# Patient Record
Sex: Female | Born: 1980 | Race: White | Hispanic: No | Marital: Married | Smoking: Never smoker
Health system: Southern US, Community
[De-identification: ages and names within clinical notes are randomized; demographics above are authoritative.]

## PROBLEM LIST (undated history)

## (undated) DIAGNOSIS — N189 Chronic kidney disease, unspecified: Secondary | ICD-10-CM

## (undated) DIAGNOSIS — R51 Headache: Secondary | ICD-10-CM

## (undated) DIAGNOSIS — I10 Essential (primary) hypertension: Secondary | ICD-10-CM

## (undated) DIAGNOSIS — IMO0002 Reserved for concepts with insufficient information to code with codable children: Secondary | ICD-10-CM

## (undated) DIAGNOSIS — D649 Anemia, unspecified: Secondary | ICD-10-CM

## (undated) HISTORY — PX: KIDNEY TRANSPLANT: SHX239

---

## 2003-02-28 ENCOUNTER — Encounter: Payer: Self-pay | Admitting: Family Medicine

## 2003-02-28 ENCOUNTER — Ambulatory Visit (HOSPITAL_COMMUNITY): Admission: RE | Admit: 2003-02-28 | Discharge: 2003-02-28 | Payer: Self-pay | Admitting: Family Medicine

## 2003-12-14 ENCOUNTER — Encounter: Admission: RE | Admit: 2003-12-14 | Discharge: 2003-12-14 | Payer: Self-pay | Admitting: Internal Medicine

## 2004-01-02 ENCOUNTER — Encounter: Admission: RE | Admit: 2004-01-02 | Discharge: 2004-01-02 | Payer: Self-pay | Admitting: Internal Medicine

## 2004-02-13 ENCOUNTER — Encounter: Admission: RE | Admit: 2004-02-13 | Discharge: 2004-02-13 | Payer: Self-pay | Admitting: Internal Medicine

## 2004-06-04 ENCOUNTER — Ambulatory Visit: Payer: Self-pay | Admitting: Internal Medicine

## 2004-06-11 ENCOUNTER — Ambulatory Visit: Payer: Self-pay | Admitting: Internal Medicine

## 2004-08-15 ENCOUNTER — Ambulatory Visit: Payer: Self-pay | Admitting: Internal Medicine

## 2004-10-01 ENCOUNTER — Ambulatory Visit: Payer: Self-pay | Admitting: Internal Medicine

## 2005-07-08 ENCOUNTER — Ambulatory Visit: Payer: Self-pay | Admitting: Internal Medicine

## 2005-08-21 ENCOUNTER — Ambulatory Visit: Payer: Self-pay | Admitting: Internal Medicine

## 2005-09-18 ENCOUNTER — Ambulatory Visit: Payer: Self-pay | Admitting: Internal Medicine

## 2005-10-16 ENCOUNTER — Encounter: Admission: RE | Admit: 2005-10-16 | Discharge: 2005-10-16 | Payer: Self-pay | Admitting: Nephrology

## 2005-10-16 ENCOUNTER — Ambulatory Visit: Payer: Self-pay | Admitting: Internal Medicine

## 2006-01-02 ENCOUNTER — Ambulatory Visit: Payer: Self-pay | Admitting: Internal Medicine

## 2006-03-20 ENCOUNTER — Ambulatory Visit: Payer: Self-pay | Admitting: Internal Medicine

## 2006-03-30 ENCOUNTER — Ambulatory Visit: Payer: Self-pay | Admitting: Internal Medicine

## 2006-04-20 ENCOUNTER — Ambulatory Visit: Payer: Self-pay | Admitting: Internal Medicine

## 2006-07-21 ENCOUNTER — Encounter (INDEPENDENT_AMBULATORY_CARE_PROVIDER_SITE_OTHER): Payer: Self-pay | Admitting: Internal Medicine

## 2006-07-31 ENCOUNTER — Ambulatory Visit: Payer: Self-pay | Admitting: Internal Medicine

## 2006-07-31 LAB — CONVERTED CEMR LAB
AST: 18 units/L (ref 0–37)
BUN: 31 mg/dL — ABNORMAL HIGH (ref 6–23)
CO2: 24 meq/L (ref 19–32)
Chloride: 106 meq/L (ref 96–112)
HCT: 33.2 % — ABNORMAL LOW (ref 36.0–46.0)
Hemoglobin: 11.2 g/dL — ABNORMAL LOW (ref 12.0–15.0)
Platelets: 222 10*3/uL (ref 150–400)
RBC: 3.84 M/uL — ABNORMAL LOW (ref 3.87–5.11)
Total Bilirubin: 1.3 mg/dL — ABNORMAL HIGH (ref 0.3–1.2)
Total Protein: 6.2 g/dL (ref 6.0–8.3)

## 2006-09-07 ENCOUNTER — Ambulatory Visit: Payer: Self-pay | Admitting: Internal Medicine

## 2006-09-07 LAB — CONVERTED CEMR LAB
BUN: 36 mg/dL — ABNORMAL HIGH (ref 6–23)
Calcium: 9.2 mg/dL (ref 8.4–10.5)
Chloride: 107 meq/L (ref 96–112)
Creatinine, Ser: 2.96 mg/dL — ABNORMAL HIGH (ref 0.40–1.20)
Glucose, Bld: 81 mg/dL (ref 70–99)

## 2006-10-21 ENCOUNTER — Encounter (INDEPENDENT_AMBULATORY_CARE_PROVIDER_SITE_OTHER): Payer: Self-pay | Admitting: Internal Medicine

## 2006-10-22 ENCOUNTER — Encounter (INDEPENDENT_AMBULATORY_CARE_PROVIDER_SITE_OTHER): Payer: Self-pay | Admitting: Internal Medicine

## 2006-10-23 ENCOUNTER — Ambulatory Visit: Payer: Self-pay | Admitting: Internal Medicine

## 2007-01-07 ENCOUNTER — Encounter (INDEPENDENT_AMBULATORY_CARE_PROVIDER_SITE_OTHER): Payer: Self-pay | Admitting: Internal Medicine

## 2007-02-10 ENCOUNTER — Encounter (INDEPENDENT_AMBULATORY_CARE_PROVIDER_SITE_OTHER): Payer: Self-pay | Admitting: Internal Medicine

## 2007-03-26 ENCOUNTER — Encounter: Admission: RE | Admit: 2007-03-26 | Discharge: 2007-03-26 | Payer: Self-pay | Admitting: Family Medicine

## 2007-05-10 ENCOUNTER — Encounter (INDEPENDENT_AMBULATORY_CARE_PROVIDER_SITE_OTHER): Payer: Self-pay | Admitting: Internal Medicine

## 2007-10-08 ENCOUNTER — Encounter (INDEPENDENT_AMBULATORY_CARE_PROVIDER_SITE_OTHER): Payer: Self-pay | Admitting: Internal Medicine

## 2008-02-09 ENCOUNTER — Encounter (INDEPENDENT_AMBULATORY_CARE_PROVIDER_SITE_OTHER): Payer: Self-pay | Admitting: Internal Medicine

## 2008-04-28 ENCOUNTER — Encounter (INDEPENDENT_AMBULATORY_CARE_PROVIDER_SITE_OTHER): Payer: Self-pay | Admitting: Internal Medicine

## 2008-07-07 ENCOUNTER — Encounter (INDEPENDENT_AMBULATORY_CARE_PROVIDER_SITE_OTHER): Payer: Self-pay | Admitting: Internal Medicine

## 2008-08-23 ENCOUNTER — Encounter (INDEPENDENT_AMBULATORY_CARE_PROVIDER_SITE_OTHER): Payer: Self-pay | Admitting: Internal Medicine

## 2008-10-06 ENCOUNTER — Encounter (HOSPITAL_COMMUNITY): Admission: RE | Admit: 2008-10-06 | Discharge: 2009-01-04 | Payer: Self-pay

## 2008-11-16 ENCOUNTER — Encounter (INDEPENDENT_AMBULATORY_CARE_PROVIDER_SITE_OTHER): Payer: Self-pay | Admitting: Internal Medicine

## 2009-01-05 ENCOUNTER — Encounter (HOSPITAL_COMMUNITY): Admission: RE | Admit: 2009-01-05 | Discharge: 2009-04-05 | Payer: Self-pay | Admitting: Nephrology

## 2009-04-19 ENCOUNTER — Encounter (HOSPITAL_COMMUNITY): Admission: RE | Admit: 2009-04-19 | Discharge: 2009-07-18 | Payer: Self-pay | Admitting: Nephrology

## 2009-07-19 ENCOUNTER — Encounter (HOSPITAL_COMMUNITY): Admission: RE | Admit: 2009-07-19 | Discharge: 2009-10-17 | Payer: Self-pay | Admitting: Nephrology

## 2009-10-19 ENCOUNTER — Encounter (HOSPITAL_COMMUNITY): Admission: RE | Admit: 2009-10-19 | Discharge: 2010-01-17 | Payer: Self-pay | Admitting: Nephrology

## 2010-02-01 ENCOUNTER — Encounter (HOSPITAL_COMMUNITY): Admission: RE | Admit: 2010-02-01 | Discharge: 2010-05-02 | Payer: Self-pay | Admitting: Nephrology

## 2010-03-05 ENCOUNTER — Other Ambulatory Visit: Admission: RE | Admit: 2010-03-05 | Discharge: 2010-03-05 | Payer: Self-pay | Admitting: Family Medicine

## 2010-05-06 ENCOUNTER — Encounter (HOSPITAL_COMMUNITY)
Admission: RE | Admit: 2010-05-06 | Discharge: 2010-08-04 | Payer: Self-pay | Source: Home / Self Care | Attending: Nephrology | Admitting: Nephrology

## 2010-06-20 DIAGNOSIS — N189 Chronic kidney disease, unspecified: Secondary | ICD-10-CM

## 2010-06-20 HISTORY — DX: Chronic kidney disease, unspecified: N18.9

## 2010-08-11 ENCOUNTER — Encounter: Payer: Self-pay | Admitting: Obstetrics and Gynecology

## 2010-08-18 LAB — CONVERTED CEMR LAB
ALT: 9 units/L (ref 0–35)
AST: 18 units/L (ref 0–37)
Albumin: 3.9 g/dL (ref 3.5–5.2)
Alkaline Phosphatase: 47 units/L (ref 39–117)
Bilirubin Urine: NEGATIVE
Calcium: 9.5 mg/dL (ref 8.4–10.5)
HCT: 33.7 % — ABNORMAL LOW (ref 36.0–46.0)
Hemoglobin: 11.6 g/dL — ABNORMAL LOW (ref 12.0–15.0)
Ketones, ur: NEGATIVE mg/dL
Leukocytes, UA: NEGATIVE
MCHC: 34.4 g/dL (ref 30.0–36.0)
MCV: 85.3 fL (ref 78.0–100.0)
PTH: 47.7 pg/mL (ref 14.0–72.0)
Platelets: 251 10*3/uL (ref 150–400)
Protein, ur: 100 mg/dL — AB
RBC: 3.95 M/uL (ref 3.87–5.11)
RDW: 13 % (ref 11.5–14.0)
Sodium: 137 meq/L (ref 135–145)
Specific Gravity, Urine: 1.009 (ref 1.005–1.03)
Total CHOL/HDL Ratio: 2.8
Urine Glucose: NEGATIVE mg/dL
Urobilinogen, UA: 0.2 (ref 0.0–1.0)
WBC: 6.9 10*3/uL (ref 4.0–10.5)
pH: 6 (ref 5.0–8.0)

## 2010-08-19 ENCOUNTER — Other Ambulatory Visit (HOSPITAL_COMMUNITY): Payer: Self-pay | Admitting: Endocrinology

## 2010-08-19 DIAGNOSIS — E0590 Thyrotoxicosis, unspecified without thyrotoxic crisis or storm: Secondary | ICD-10-CM

## 2010-09-04 ENCOUNTER — Ambulatory Visit (HOSPITAL_COMMUNITY)
Admission: RE | Admit: 2010-09-04 | Discharge: 2010-09-04 | Disposition: A | Discharge status: Home / Self Care | Payer: Managed Care, Other (non HMO) | Source: Ambulatory Visit | Attending: Endocrinology | Admitting: Endocrinology

## 2010-09-04 DIAGNOSIS — E0590 Thyrotoxicosis, unspecified without thyrotoxic crisis or storm: Secondary | ICD-10-CM

## 2010-09-05 ENCOUNTER — Ambulatory Visit (HOSPITAL_COMMUNITY)
Admission: RE | Admit: 2010-09-05 | Discharge: 2010-09-05 | Disposition: A | Discharge status: Home / Self Care | Payer: Managed Care, Other (non HMO) | Source: Ambulatory Visit | Attending: Endocrinology | Admitting: Endocrinology

## 2010-09-05 DIAGNOSIS — E0590 Thyrotoxicosis, unspecified without thyrotoxic crisis or storm: Secondary | ICD-10-CM | POA: Insufficient documentation

## 2010-09-05 MED ORDER — SODIUM PERTECHNETATE TC 99M INJECTION
10.0000 | Freq: Once | INTRAVENOUS | Status: AC | PRN
  Administered 2010-09-05: 10 via INTRAVENOUS

## 2010-09-05 MED ORDER — SODIUM IODIDE I 131 CAPSULE
1.0000 | Freq: Once | INTRAVENOUS | Status: AC | PRN
  Administered 2010-09-05: 1 via ORAL

## 2010-10-01 LAB — POCT HEMOGLOBIN-HEMACUE: Hemoglobin: 10.2 g/dL — ABNORMAL LOW (ref 12.0–15.0)

## 2010-10-01 LAB — IRON AND TIBC
Iron: 94 ug/dL (ref 42–135)
Saturation Ratios: 31 % (ref 20–55)
TIBC: 299 ug/dL (ref 250–470)

## 2010-10-02 LAB — IRON AND TIBC
Saturation Ratios: 34 % (ref 20–55)
TIBC: 313 ug/dL (ref 250–470)
UIBC: 207 ug/dL

## 2010-10-03 LAB — POCT HEMOGLOBIN-HEMACUE: Hemoglobin: 10.7 g/dL — ABNORMAL LOW (ref 12.0–15.0)

## 2010-10-03 LAB — IRON AND TIBC: Iron: 96 ug/dL (ref 42–135)

## 2010-10-04 LAB — IRON AND TIBC
Iron: 138 ug/dL — ABNORMAL HIGH (ref 42–135)
Saturation Ratios: 40 % (ref 20–55)
TIBC: 348 ug/dL (ref 250–470)

## 2010-10-04 LAB — FERRITIN: Ferritin: 302 ng/mL — ABNORMAL HIGH (ref 10–291)

## 2010-10-04 LAB — POCT HEMOGLOBIN-HEMACUE: Hemoglobin: 10.8 g/dL — ABNORMAL LOW (ref 12.0–15.0)

## 2010-10-05 LAB — CBC
Hemoglobin: 10.1 g/dL — ABNORMAL LOW (ref 12.0–15.0)
MCH: 31.9 pg (ref 26.0–34.0)
MCHC: 34.3 g/dL (ref 30.0–36.0)
MCV: 92.9 fL (ref 78.0–100.0)
Platelets: 161 10*3/uL (ref 150–400)

## 2010-10-05 LAB — IRON AND TIBC
Iron: 147 ug/dL — ABNORMAL HIGH (ref 42–135)
Saturation Ratios: 46 % (ref 20–55)
UIBC: 171 ug/dL

## 2010-10-05 LAB — POCT HEMOGLOBIN-HEMACUE: Hemoglobin: 10.8 g/dL — ABNORMAL LOW (ref 12.0–15.0)

## 2010-10-05 LAB — RENAL FUNCTION PANEL
BUN: 44 mg/dL — ABNORMAL HIGH (ref 6–23)
CO2: 22 mEq/L (ref 19–32)
Glucose, Bld: 170 mg/dL — ABNORMAL HIGH (ref 70–99)
Phosphorus: 3.5 mg/dL (ref 2.3–4.6)
Potassium: 4.4 mEq/L (ref 3.5–5.1)
Sodium: 134 mEq/L — ABNORMAL LOW (ref 135–145)

## 2010-10-05 LAB — FERRITIN: Ferritin: 271 ng/mL (ref 10–291)

## 2010-10-05 LAB — PTH, INTACT AND CALCIUM: PTH: 165.9 pg/mL — ABNORMAL HIGH (ref 14.0–72.0)

## 2010-10-06 LAB — RENAL FUNCTION PANEL
BUN: 52 mg/dL — ABNORMAL HIGH (ref 6–23)
CO2: 22 mEq/L (ref 19–32)
Calcium: 9 mg/dL (ref 8.4–10.5)
Creatinine, Ser: 5.52 mg/dL — ABNORMAL HIGH (ref 0.4–1.2)
Glucose, Bld: 91 mg/dL (ref 70–99)
Phosphorus: 5.6 mg/dL — ABNORMAL HIGH (ref 2.3–4.6)
Sodium: 140 mEq/L (ref 135–145)

## 2010-10-06 LAB — POCT HEMOGLOBIN-HEMACUE: Hemoglobin: 12.1 g/dL (ref 12.0–15.0)

## 2010-10-06 LAB — IRON AND TIBC
Saturation Ratios: 44 % (ref 20–55)
UIBC: 171 ug/dL

## 2010-10-06 LAB — FERRITIN: Ferritin: 250 ng/mL (ref 10–291)

## 2010-10-07 LAB — POCT HEMOGLOBIN-HEMACUE: Hemoglobin: 11.5 g/dL — ABNORMAL LOW (ref 12.0–15.0)

## 2010-10-07 LAB — IRON AND TIBC
Iron: 107 ug/dL (ref 42–135)
Saturation Ratios: 35 % (ref 20–55)
UIBC: 201 ug/dL

## 2010-10-08 LAB — IRON AND TIBC
Iron: 121 ug/dL (ref 42–135)
Saturation Ratios: 38 % (ref 20–55)
Saturation Ratios: 35 % (ref 20–55)
UIBC: 199 ug/dL

## 2010-10-08 LAB — POCT HEMOGLOBIN-HEMACUE
Hemoglobin: 10.9 g/dL — ABNORMAL LOW (ref 12.0–15.0)
Hemoglobin: 11.2 g/dL — ABNORMAL LOW (ref 12.0–15.0)

## 2010-10-08 LAB — FERRITIN: Ferritin: 276 ng/mL (ref 10–291)

## 2010-10-09 LAB — POCT HEMOGLOBIN-HEMACUE
Hemoglobin: 11.2 g/dL — ABNORMAL LOW (ref 12.0–15.0)
Hemoglobin: 11.2 g/dL — ABNORMAL LOW (ref 12.0–15.0)

## 2010-10-09 LAB — IRON AND TIBC
Iron: 128 ug/dL (ref 42–135)
Saturation Ratios: 42 % (ref 20–55)
TIBC: 304 ug/dL (ref 250–470)

## 2010-10-14 LAB — RENAL FUNCTION PANEL
Albumin: 3.7 g/dL (ref 3.5–5.2)
BUN: 45 mg/dL — ABNORMAL HIGH (ref 6–23)
BUN: 45 mg/dL — ABNORMAL HIGH (ref 6–23)
CO2: 23 mEq/L (ref 19–32)
Chloride: 104 mEq/L (ref 96–112)
Chloride: 107 mEq/L (ref 96–112)
Creatinine, Ser: 5.56 mg/dL — ABNORMAL HIGH (ref 0.4–1.2)
GFR calc Af Amer: 11 mL/min — ABNORMAL LOW (ref >60)
GFR calc non Af Amer: 9 mL/min — ABNORMAL LOW (ref >60)
Glucose, Bld: 86 mg/dL (ref 70–99)
Phosphorus: 4.7 mg/dL — ABNORMAL HIGH (ref 2.3–4.6)
Potassium: 4.3 mEq/L (ref 3.5–5.1)
Potassium: 5.2 mEq/L — ABNORMAL HIGH (ref 3.5–5.1)

## 2010-10-14 LAB — IRON AND TIBC
Iron: 122 ug/dL (ref 42–135)
UIBC: 202 ug/dL

## 2010-10-14 LAB — CBC
HCT: 33 % — ABNORMAL LOW (ref 36.0–46.0)
Hemoglobin: 11.5 g/dL — ABNORMAL LOW (ref 12.0–15.0)
MCHC: 35 g/dL (ref 30.0–36.0)
MCV: 94.2 fL (ref 78.0–100.0)
RBC: 3.5 MIL/uL — ABNORMAL LOW (ref 3.87–5.11)
WBC: 7.1 10*3/uL (ref 4.0–10.5)

## 2010-10-14 LAB — FERRITIN: Ferritin: 245 ng/mL (ref 10–291)

## 2010-10-21 LAB — IRON AND TIBC
Iron: 127 ug/dL (ref 42–135)
TIBC: 294 ug/dL (ref 250–470)

## 2010-10-21 LAB — FERRITIN: Ferritin: 287 ng/mL (ref 10–291)

## 2010-10-22 LAB — IRON AND TIBC
Iron: 133 ug/dL (ref 42–135)
TIBC: 321 ug/dL (ref 250–470)
UIBC: 188 ug/dL

## 2010-10-22 LAB — FERRITIN: Ferritin: 239 ng/mL (ref 10–291)

## 2010-10-24 LAB — IRON AND TIBC: UIBC: 218 ug/dL

## 2010-10-24 LAB — FERRITIN: Ferritin: 78 ng/mL (ref 10–291)

## 2010-10-24 LAB — POCT HEMOGLOBIN-HEMACUE: Hemoglobin: 10.2 g/dL — ABNORMAL LOW (ref 12.0–15.0)

## 2010-10-25 LAB — POCT HEMOGLOBIN-HEMACUE
Hemoglobin: 10.5 g/dL — ABNORMAL LOW (ref 12.0–15.0)
Hemoglobin: 10.1 g/dL — ABNORMAL LOW (ref 12.0–15.0)

## 2010-10-26 LAB — IRON AND TIBC
Iron: 132 ug/dL (ref 42–135)
Saturation Ratios: 41 % (ref 20–55)
TIBC: 324 ug/dL (ref 250–470)
UIBC: 192 ug/dL

## 2010-10-27 LAB — POCT HEMOGLOBIN-HEMACUE
Hemoglobin: 12.7 g/dL (ref 12.0–15.0)
Hemoglobin: 11.6 g/dL — ABNORMAL LOW (ref 12.0–15.0)

## 2010-10-27 LAB — FERRITIN: Ferritin: 41 ng/mL (ref 10–291)

## 2010-10-27 LAB — IRON AND TIBC: UIBC: 256 ug/dL

## 2010-10-28 LAB — IRON AND TIBC
Iron: 158 ug/dL — ABNORMAL HIGH (ref 42–135)
UIBC: 315 ug/dL

## 2010-10-28 LAB — FERRITIN
Ferritin: 28 ng/mL (ref 10–291)
Ferritin: 76 ng/mL (ref 10–291)

## 2010-10-28 LAB — POCT HEMOGLOBIN-HEMACUE: Hemoglobin: 11.2 g/dL — ABNORMAL LOW (ref 12.0–15.0)

## 2010-10-29 LAB — COMPREHENSIVE METABOLIC PANEL
ALT: 8 U/L (ref 0–35)
AST: 15 U/L (ref 0–37)
Albumin: 3.4 g/dL — ABNORMAL LOW (ref 3.5–5.2)
Alkaline Phosphatase: 38 U/L — ABNORMAL LOW (ref 39–117)
Chloride: 103 mEq/L (ref 96–112)
GFR calc Af Amer: 9 mL/min — ABNORMAL LOW (ref >60)
Potassium: 4.7 mEq/L (ref 3.5–5.1)
Total Bilirubin: 0.9 mg/dL (ref 0.3–1.2)

## 2010-10-29 LAB — POCT HEMOGLOBIN-HEMACUE
Hemoglobin: 11.5 g/dL — ABNORMAL LOW (ref 12.0–15.0)
Hemoglobin: 9.9 g/dL — ABNORMAL LOW (ref 12.0–15.0)

## 2010-10-29 LAB — CBC
HCT: 35 % — ABNORMAL LOW (ref 36.0–46.0)
Platelets: 248 10*3/uL (ref 150–400)
WBC: 7.4 10*3/uL (ref 4.0–10.5)

## 2010-10-29 LAB — DIFFERENTIAL
Basophils Absolute: 0.1 10*3/uL (ref 0.0–0.1)
Basophils Relative: 1 % (ref 0–1)
Eosinophils Relative: 1 % (ref 0–5)
Monocytes Absolute: 0.4 10*3/uL (ref 0.1–1.0)

## 2010-10-29 LAB — PTH, INTACT AND CALCIUM
Calcium, Total (PTH): 9.8 mg/dL (ref 8.4–10.5)
PTH: 15.9 pg/mL (ref 14.0–72.0)

## 2010-10-30 LAB — RENAL FUNCTION PANEL
CO2: 23 mEq/L (ref 19–32)
Calcium: 10.1 mg/dL (ref 8.4–10.5)
Chloride: 103 mEq/L (ref 96–112)
Glucose, Bld: 82 mg/dL (ref 70–99)
Sodium: 136 mEq/L (ref 135–145)

## 2010-10-30 LAB — POCT HEMOGLOBIN-HEMACUE
Hemoglobin: 10.9 g/dL — ABNORMAL LOW (ref 12.0–15.0)
Hemoglobin: 11.5 g/dL — ABNORMAL LOW (ref 12.0–15.0)
Hemoglobin: 10.5 g/dL — ABNORMAL LOW (ref 12.0–15.0)

## 2010-10-30 LAB — IRON AND TIBC
Saturation Ratios: 9 % — ABNORMAL LOW (ref 20–55)
TIBC: 455 ug/dL (ref 250–470)

## 2010-10-31 LAB — IRON AND TIBC
Iron: 135 ug/dL (ref 42–135)
UIBC: 251 ug/dL

## 2010-10-31 LAB — POCT HEMOGLOBIN-HEMACUE: Hemoglobin: 9.5 g/dL — ABNORMAL LOW (ref 12.0–15.0)

## 2011-07-04 ENCOUNTER — Other Ambulatory Visit (HOSPITAL_COMMUNITY)
Admission: RE | Admit: 2011-07-04 | Discharge: 2011-07-04 | Disposition: A | Discharge status: Home / Self Care | Payer: Managed Care, Other (non HMO) | Source: Ambulatory Visit | Attending: Family Medicine | Admitting: Family Medicine

## 2011-07-04 ENCOUNTER — Other Ambulatory Visit: Payer: Self-pay | Admitting: Family Medicine

## 2011-07-04 DIAGNOSIS — Z01419 Encounter for gynecological examination (general) (routine) without abnormal findings: Secondary | ICD-10-CM | POA: Insufficient documentation

## 2011-11-12 ENCOUNTER — Ambulatory Visit (HOSPITAL_COMMUNITY): Payer: Managed Care, Other (non HMO)

## 2011-12-25 ENCOUNTER — Ambulatory Visit (HOSPITAL_COMMUNITY): Payer: Managed Care, Other (non HMO)

## 2012-02-03 ENCOUNTER — Ambulatory Visit (HOSPITAL_COMMUNITY): Payer: Managed Care, Other (non HMO)

## 2012-07-27 ENCOUNTER — Other Ambulatory Visit: Payer: Self-pay | Admitting: Family Medicine

## 2012-07-27 ENCOUNTER — Other Ambulatory Visit (HOSPITAL_COMMUNITY)
Admission: RE | Admit: 2012-07-27 | Discharge: 2012-07-27 | Disposition: A | Discharge status: Home / Self Care | Payer: Managed Care, Other (non HMO) | Source: Ambulatory Visit | Attending: Family Medicine | Admitting: Family Medicine

## 2012-07-27 DIAGNOSIS — Z01419 Encounter for gynecological examination (general) (routine) without abnormal findings: Secondary | ICD-10-CM | POA: Insufficient documentation

## 2013-03-15 ENCOUNTER — Telehealth: Payer: Self-pay | Admitting: *Deleted

## 2013-03-15 NOTE — Telephone Encounter (Signed)
Pt left message stating that she is [redacted] wks pregnant, has appt in our office next week and has been having vaginal spotting 2-3 times within the last day or so. She wants to know if she should limit any type of activity.

## 2013-03-16 NOTE — Telephone Encounter (Signed)
Called [name] and left a message we are returning your call - we do recommend you be evaluated but we are closed- please come to MAU for evaluation. ( per advice [name], CNM)

## 2013-03-18 ENCOUNTER — Encounter (HOSPITAL_COMMUNITY): Payer: Self-pay | Admitting: *Deleted

## 2013-03-18 ENCOUNTER — Inpatient Hospital Stay (HOSPITAL_COMMUNITY)
Admission: AD | Admit: 2013-03-18 | Discharge: 2013-03-18 | Disposition: A | Discharge status: Home / Self Care | Payer: Managed Care, Other (non HMO) | Source: Ambulatory Visit | Attending: Obstetrics & Gynecology | Admitting: Obstetrics & Gynecology

## 2013-03-18 ENCOUNTER — Inpatient Hospital Stay (HOSPITAL_COMMUNITY): Payer: Managed Care, Other (non HMO)

## 2013-03-18 DIAGNOSIS — O209 Hemorrhage in early pregnancy, unspecified: Secondary | ICD-10-CM

## 2013-03-18 DIAGNOSIS — O4690 Antepartum hemorrhage, unspecified, unspecified trimester: Secondary | ICD-10-CM

## 2013-03-18 DIAGNOSIS — O200 Threatened abortion: Secondary | ICD-10-CM | POA: Insufficient documentation

## 2013-03-18 HISTORY — DX: Reserved for concepts with insufficient information to code with codable children: IMO0002

## 2013-03-18 HISTORY — DX: Chronic kidney disease, unspecified: N18.9

## 2013-03-18 HISTORY — DX: Headache: R51

## 2013-03-18 HISTORY — DX: Anemia, unspecified: D64.9

## 2013-03-18 HISTORY — DX: Essential (primary) hypertension: I10

## 2013-03-18 LAB — URINALYSIS, ROUTINE W REFLEX MICROSCOPIC
Ketones, ur: NEGATIVE mg/dL
Leukocytes, UA: NEGATIVE
Protein, ur: NEGATIVE mg/dL
Urobilinogen, UA: 0.2 mg/dL (ref 0.0–1.0)

## 2013-03-18 LAB — CBC
MCV: 91.4 fL (ref 78.0–100.0)
Platelets: 251 10*3/uL (ref 150–400)
RBC: 3.61 MIL/uL — ABNORMAL LOW (ref 3.87–5.11)
RDW: 12.6 % (ref 11.5–15.5)
WBC: 9 10*3/uL (ref 4.0–10.5)

## 2013-03-18 LAB — HCG, QUANTITATIVE, PREGNANCY: hCG, Beta Chain, Quant, S: 35617 m[IU]/mL — ABNORMAL HIGH (ref <5)

## 2013-03-18 LAB — POCT PREGNANCY, URINE: Preg Test, Ur: POSITIVE — AB

## 2013-03-18 LAB — WET PREP, GENITAL

## 2013-03-18 NOTE — MAU Note (Signed)
Should be 8 wks preg.  Had intercourse last wk on Wed, the next day had some bright red spotting, had further spotting; Sunday noted blood when wiped. Is a High Risk pt. Bright yellow d/c this morning.no further bleeding. Was told to come in due to hx.

## 2013-03-18 NOTE — MAU Provider Note (Signed)
History     CSN: [id number]  Arrival date and time: 03/18/13 1421   First Provider Initiated Contact with Patient 03/18/13 1609      Chief Complaint  Patient presents with  . Vaginal Bleeding  . Possible Pregnancy   HPI  [name] is 32 y.o. female G1, at [redacted]w[redacted]d who presents with complaints of vaginal spotting that started 1 week ago. The spotting started last Friday and continued throughout the weekend. She noticed the bleeding a total of three times and it was a very scant amount. The patient has an appointment scheduled the first of September with the high risk clinic due to history of kidney disease with transplant. She denies bleeding, spotting or abdominal pain at this time.   OB History   Grav Para Term Preterm Abortions TAB SAB Ect Mult Living   1               Past Medical History  Diagnosis Date  . Headache(784.0)   . Anemia   . 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 Abnormal Pap smear     40yrs ago, since all ok    Past Surgical History  Procedure Laterality Date  . Kidney transplant      Family History  Problem Relation Age of Onset  . Arthritis Mother   . Hypertension Father   . Hyperlipidemia Father   . Cancer Paternal Grandmother     History  Substance Use Topics  . Smoking status: Never Smoker   . Smokeless tobacco: Never Used  . Alcohol Use: No    Allergies:  Allergies  Allergen Reactions  . Ibuprofen Other (See Comments)    Patient is a kidney transplant patient.    Prescriptions prior to admission  Medication Sig Dispense Refill  . azaTHIOprine (IMURAN) 50 MG tablet Take 75 mg by mouth daily.      . Cholecalciferol (VITAMIN D) 2000 UNITS tablet Take 2,000 Units by mouth daily.      . cycloSPORINE modified (NEORAL) 100 MG capsule Take 100 mg by mouth 2 (two) times daily. Combines with a 25 mg capsule for a total dose of  125 mg twice daily.      . cycloSPORINE modified (NEORAL) 25 MG capsule Take 25 mg by mouth 2 (two) times daily. Combines with a 100 mg capsule for a total dose of 125 mg twice daily.      . diphenhydrAMINE (BENADRYL) 25 MG tablet Take 12.5-25 mg by mouth at bedtime as needed for sleep.      Marland Kitchen labetalol (NORMODYNE) 100 MG tablet Take 50 mg by mouth daily.      . predniSONE (DELTASONE) 5 MG tablet Take 5 mg by mouth daily.      . Prenatal Vit-Fe Fumarate-FA (PRENATAL MULTIVITAMIN) TABS tablet Take 1 tablet by mouth daily with supper.       Results for orders placed during the hospital encounter of 03/18/13 (from the past 24 hour(s))  URINALYSIS, ROUTINE W REFLEX MICROSCOPIC     Status: None   Collection Time    03/18/13  2:40 PM      Result Value Range   Color, Urine YELLOW  YELLOW   APPearance CLEAR  CLEAR   Specific Gravity, Urine 1.015  1.005 - 1.030   pH 6.0  5.0 - 8.0   Glucose, UA NEGATIVE  NEGATIVE  mg/dL   Hgb urine dipstick NEGATIVE  NEGATIVE   Bilirubin Urine NEGATIVE  NEGATIVE   Ketones, ur NEGATIVE  NEGATIVE mg/dL   Protein, ur NEGATIVE  NEGATIVE mg/dL   Urobilinogen, UA 0.2  0.0 - 1.0 mg/dL   Nitrite NEGATIVE  NEGATIVE   Leukocytes, UA NEGATIVE  NEGATIVE  POCT PREGNANCY, URINE     Status: Abnormal   Collection Time    03/18/13  2:43 PM      Result Value Range   Preg Test, Ur POSITIVE (*) NEGATIVE  WET PREP, GENITAL     Status: Abnormal   Collection Time    03/18/13  4:15 PM      Result Value Range   Yeast Wet Prep HPF POC NONE SEEN  NONE SEEN   Trich, Wet Prep NONE SEEN  NONE SEEN   Clue Cells Wet Prep HPF POC NONE SEEN  NONE SEEN   WBC, Wet Prep HPF POC FEW (*) NONE SEEN  CBC     Status: Abnormal   Collection Time    03/18/13  4:54 PM      Result Value Range   WBC 9.0  4.0 - 10.5 K/uL   RBC 3.61 (*) 3.87 - 5.11 MIL/uL   Hemoglobin 11.4 (*) 12.0 - 15.0 g/dL   HCT 16.1 (*) 09.6 - 04.5 %   MCV 91.4  78.0 - 100.0 fL   MCH 31.6  26.0 - 34.0 pg   MCHC 34.5  30.0 -  36.0 g/dL   RDW 40.9  81.1 - 91.4 %   Platelets 251  150 - 400 K/uL  ABO/RH     Status: None   Collection Time    03/18/13  4:54 PM      Result Value Range   ABO/RH(D) AB POS    HCG, QUANTITATIVE, PREGNANCY     Status: Abnormal   Collection Time    03/18/13  4:54 PM      Result Value Range   hCG, Beta Chain, Quant, [place] [address] (*) <5 mIU/mL   US Ob Comp Less 14 Wks  03/18/2013   CLINICAL DATA:  Vaginal spotting.  EXAM: OBSTETRIC <14 WK ULTRASOUND  TECHNIQUE: Transabdominal ultrasound was performed for evaluation of the gestation as well as the maternal uterus and adnexal regions.  COMPARISON:  None.  FINDINGS: Intrauterine gestational sac: Visualized/normal in shape.  Yolk sac:  Present  Embryo:  Present  Cardiac Activity: Present  Heart Rate: 82 bpm  CRL:   3.6  mm   6 w 1 d                  [place] EDC: 11/10/2013  Maternal uterus/adnexae: Right corpus luteal cyst. No adnexal masses. Small amount of free fluid. Uterus is retroflexed.  IMPRESSION: Six week 1 day intrauterine pregnancy. Fetal heart rate low at 82 beats per min. No visible subchorionic hemorrhage.  Small amount of free fluid in the pelvis.   Electronically Signed   By: Charlett Nose   On: 03/18/2013 17:35   US Ob Transvaginal  03/18/2013   CLINICAL DATA:  Vaginal spotting.  EXAM: OBSTETRIC <14 WK ULTRASOUND  TECHNIQUE: Transabdominal ultrasound was performed for evaluation of the gestation as well as the maternal uterus and adnexal regions.  COMPARISON:  None.  FINDINGS: Intrauterine gestational sac: Visualized/normal in shape.  Yolk sac:  Present  Embryo:  Present  Cardiac Activity: Present  Heart Rate: 82 bpm  CRL:   3.6  mm   6 w  1 d                  [place] EDC: 11/10/2013  Maternal uterus/adnexae: Right corpus luteal cyst. No adnexal masses. Small amount of free fluid. Uterus is retroflexed.  IMPRESSION: Six week 1 day intrauterine pregnancy. Fetal heart rate low at 82 beats per min. No visible subchorionic hemorrhage.  Small amount of free  fluid in the pelvis.   Electronically Signed   By: Charlett Nose   On: 03/18/2013 17:35    Review of Systems  Constitutional: Negative for fever and chills.  Gastrointestinal: Negative for nausea, vomiting, abdominal pain, diarrhea and constipation.  Genitourinary: Negative for dysuria, urgency, frequency and hematuria.       No vaginal discharge. No vaginal bleeding. No dysuria.   Neurological: Negative for headaches.   Physical Exam   Blood pressure 136/75, pulse 77, temperature 98.2 F (36.8 C), temperature source Oral, resp. rate 18, height 5' 5.5" (1.664 m), weight 60.782 kg (134 lb), last menstrual period 01/21/2013.  Physical Exam  Constitutional: She is oriented to person, place, and time. She appears well-developed and well-nourished. No distress.  Neck: Neck supple.  GI: Soft. She exhibits no distension and no mass. There is no tenderness. There is no rebound and no guarding.  Genitourinary: Vagina normal and uterus normal.  Speculum exam: Vagina - Small amount of creamy discharge, no odor.  Cervix - No contact bleeding, Scant, stringy brown discharge at cervical os Bimanual exam: Cervix closed Uterus non tender, gravid; normal size for gestational age Adnexa non tender, no masses bilaterally GC/Chlam, wet prep done Chaperone present for exam.   Neurological: She is alert and oriented to person, place, and time.  Skin: Skin is warm and dry. She is not diaphoretic.    MAU Course  Procedures  UA UPT Beta Hcg CBC ABO/RH Wet prep  GC/Chlamydia- Pending  [place]  Pt informed and discussed with at length the [place] results/readings. This is a very wanted pregnancy and patient and spouse are anxious regarding the outcome of this pregnancy. I informed them that it may be possible for the clinic to repeat the [place] at her first prenatal next Thursday.  Assessment and Plan  A: Intrauterine pregnancy based on [place] 03/18/2013 Threatened miscarriage  AB positive blood type    P: Discharge home Keep your appointment with the high risk clinic 03/24/2013 If your symptoms worsen; increase pain or bleeding return to MAU Take a prenatal vitamin daily.  Pelvic rest discussed     Clayborne Divis IRENE FNP-C 03/18/2013, 6:16 PM

## 2013-03-18 NOTE — Telephone Encounter (Signed)
Called patient, stating I was returning her phone call. Patient stated she started having very light spotting Friday that lasted through Sunday but it was hardly anything at all and today she started to have a neon yellow d/c but no other symptoms like itching or odor. Told patient that if she has had sex recently that can cause spotting especially in early pregnancy but as a precaution I would go to MAU for evaluation just to make sure everything is okay. Patient verbalized understanding and had no further questions

## 2013-03-19 LAB — GC/CHLAMYDIA PROBE AMP: GC Probe RNA: NEGATIVE

## 2013-03-23 ENCOUNTER — Encounter: Payer: Self-pay | Admitting: *Deleted

## 2013-03-24 ENCOUNTER — Encounter: Payer: Self-pay | Admitting: Family

## 2013-03-24 ENCOUNTER — Ambulatory Visit (INDEPENDENT_AMBULATORY_CARE_PROVIDER_SITE_OTHER): Payer: Managed Care, Other (non HMO) | Admitting: Family

## 2013-03-24 ENCOUNTER — Ambulatory Visit (HOSPITAL_COMMUNITY)
Admission: RE | Admit: 2013-03-24 | Discharge: 2013-03-24 | Disposition: A | Discharge status: Home / Self Care | Payer: Managed Care, Other (non HMO) | Source: Ambulatory Visit | Attending: Family | Admitting: Family

## 2013-03-24 VITALS — BP 125/84 | Temp 97.3°F | Wt 133.9 lb

## 2013-03-24 DIAGNOSIS — O99019 Anemia complicating pregnancy, unspecified trimester: Secondary | ICD-10-CM

## 2013-03-24 DIAGNOSIS — O139 Gestational [pregnancy-induced] hypertension without significant proteinuria, unspecified trimester: Secondary | ICD-10-CM

## 2013-03-24 DIAGNOSIS — Z3689 Encounter for other specified antenatal screening: Secondary | ICD-10-CM | POA: Insufficient documentation

## 2013-03-24 DIAGNOSIS — O26839 Pregnancy related renal disease, unspecified trimester: Secondary | ICD-10-CM

## 2013-03-24 DIAGNOSIS — O0991 Supervision of high risk pregnancy, unspecified, first trimester: Secondary | ICD-10-CM

## 2013-03-24 DIAGNOSIS — O021 Missed abortion: Secondary | ICD-10-CM | POA: Insufficient documentation

## 2013-03-24 LAB — POCT URINALYSIS DIP (DEVICE)
Bilirubin Urine: NEGATIVE
Glucose, UA: NEGATIVE mg/dL
Hgb urine dipstick: NEGATIVE
Protein, ur: NEGATIVE mg/dL
pH: 7 (ref 5.0–8.0)

## 2013-03-24 NOTE — Progress Notes (Signed)
Pulse- 100  Pt reports spotting New ob packet given Weight gain of 25-35lbs

## 2013-03-24 NOTE — Progress Notes (Signed)
Pt seen for new OB appt.  Referred to High Risk clinic due to history of renal transplant in 2011.  Pt followed by Dr. Hyman Hopes (nephology) with labs drawn month, with plans to draw labs every 2 weeks while pregnant.  Reports doing well medically with transplant, no complications thus far.  Seen in MAU on 03/18/13 for bleeding in pregnancy.  Fetal pole measured 6.1 with a FHR 82.  Reports pink spotting yesterday, but no active bleeding.  Sent to [facility] for informal [place], unable to see fetal pole > pt sent for official ultrasound > fetal pole measuring 6.2 wks IUP (miminal growth), with a detectable heartbeat, but to slow to get an actual count per ultrasound tech.  Results given to pt and explained that pt will likely miscarry.  Rescan in one week.  Ultrasound scheduled for next Thursday at 8:00am.  Bleeding precautions given.

## 2013-03-24 NOTE — Addendum Note (Signed)
Addended by: Franchot Mimes on: 03/24/2013 04:51 PM   Modules accepted: Orders

## 2013-03-24 NOTE — Progress Notes (Signed)
Informal [place] for viability with abdominal probe- gestational sac seen, no fetal pole visualized.  Pt sent to radiology for trans vag scan. Eino Farber Muhammad notified.

## 2013-03-28 ENCOUNTER — Other Ambulatory Visit: Payer: Self-pay | Admitting: Family

## 2013-03-28 MED ORDER — CEPHALEXIN 500 MG PO CAPS: 500.0000 mg | ORAL_CAPSULE | Freq: Three times a day (TID) | ORAL | Status: DC

## 2013-03-28 NOTE — Progress Notes (Signed)
Pt notified regarding UTI and RX sent to [facility] in [place] > will notify nephrologist.

## 2013-03-30 ENCOUNTER — Encounter: Payer: Self-pay | Admitting: *Deleted

## 2013-03-30 DIAGNOSIS — O99019 Anemia complicating pregnancy, unspecified trimester: Secondary | ICD-10-CM | POA: Insufficient documentation

## 2013-03-30 DIAGNOSIS — O139 Gestational [pregnancy-induced] hypertension without significant proteinuria, unspecified trimester: Secondary | ICD-10-CM | POA: Insufficient documentation

## 2013-03-30 DIAGNOSIS — O26839 Pregnancy related renal disease, unspecified trimester: Secondary | ICD-10-CM | POA: Insufficient documentation

## 2013-03-31 ENCOUNTER — Inpatient Hospital Stay (HOSPITAL_COMMUNITY)
Admission: AD | Admit: 2013-03-31 | Discharge: 2013-03-31 | Disposition: A | Discharge status: Home / Self Care | Payer: Managed Care, Other (non HMO) | Source: Ambulatory Visit | Attending: Obstetrics & Gynecology | Admitting: Obstetrics & Gynecology

## 2013-03-31 ENCOUNTER — Encounter (HOSPITAL_COMMUNITY): Payer: Self-pay | Admitting: *Deleted

## 2013-03-31 ENCOUNTER — Ambulatory Visit (HOSPITAL_COMMUNITY)
Admission: RE | Admit: 2013-03-31 | Discharge: 2013-03-31 | Disposition: A | Discharge status: Home / Self Care | Payer: Managed Care, Other (non HMO) | Source: Ambulatory Visit | Attending: Family | Admitting: Family

## 2013-03-31 DIAGNOSIS — O0991 Supervision of high risk pregnancy, unspecified, first trimester: Secondary | ICD-10-CM

## 2013-03-31 DIAGNOSIS — N831 Corpus luteum cyst of ovary, unspecified side: Secondary | ICD-10-CM | POA: Insufficient documentation

## 2013-03-31 DIAGNOSIS — O021 Missed abortion: Secondary | ICD-10-CM

## 2013-03-31 DIAGNOSIS — O99019 Anemia complicating pregnancy, unspecified trimester: Secondary | ICD-10-CM

## 2013-03-31 DIAGNOSIS — O34599 Maternal care for other abnormalities of gravid uterus, unspecified trimester: Secondary | ICD-10-CM | POA: Insufficient documentation

## 2013-03-31 DIAGNOSIS — O99891 Other specified diseases and conditions complicating pregnancy: Secondary | ICD-10-CM | POA: Insufficient documentation

## 2013-03-31 DIAGNOSIS — O139 Gestational [pregnancy-induced] hypertension without significant proteinuria, unspecified trimester: Secondary | ICD-10-CM

## 2013-03-31 DIAGNOSIS — O34519 Maternal care for incarceration of gravid uterus, unspecified trimester: Secondary | ICD-10-CM | POA: Insufficient documentation

## 2013-03-31 DIAGNOSIS — O26839 Pregnancy related renal disease, unspecified trimester: Secondary | ICD-10-CM

## 2013-03-31 LAB — CBC
HCT: 31.6 % — ABNORMAL LOW (ref 36.0–46.0)
MCV: 90.3 fL (ref 78.0–100.0)
RBC: 3.5 MIL/uL — ABNORMAL LOW (ref 3.87–5.11)
WBC: 5.9 10*3/uL (ref 4.0–10.5)

## 2013-03-31 MED ORDER — OXYCODONE-ACETAMINOPHEN 5-325 MG PO TABS: 2.0000 | ORAL_TABLET | ORAL | Status: DC | PRN

## 2013-03-31 MED ORDER — MISOPROSTOL 200 MCG PO TABS: 800.0000 ug | ORAL_TABLET | Freq: Once | ORAL | Status: DC

## 2013-03-31 MED ORDER — PROMETHAZINE HCL 25 MG PO TABS: 25.0000 mg | ORAL_TABLET | Freq: Four times a day (QID) | ORAL | Status: DC | PRN

## 2013-03-31 NOTE — Progress Notes (Signed)
Offered chaplain services, pt declines @ this time.

## 2013-03-31 NOTE — MAU Note (Signed)
Pt had F/U U/S today , failed pregnancy.  To MAU for labs & instructions.

## 2013-03-31 NOTE — MAU Provider Note (Signed)
History     CSN: [id number]  Arrival date and time: 03/31/13 9629   None    Chief Complaint  Patient presents with  . Miscarriage   HPI Pt is [redacted]w[redacted]d pregnant and is sent from ultrasound with failed pregnancy.  Pt was seen by Dr. Penne Lash and informed of diagnosis And treatment options given and pt elects cytotec. Pt here for prescription.   Past Medical History  Diagnosis Date  . Headache(784.0)   . Anemia   . 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 Abnormal Pap smear     48yrs ago, since all ok    Past Surgical History  Procedure Laterality Date  . Kidney transplant      Family History  Problem Relation Age of Onset  . Arthritis Mother   . Hypertension Father   . Hyperlipidemia Father   . Cancer Paternal Grandmother     History  Substance Use Topics  . Smoking status: Never Smoker   . Smokeless tobacco: Never Used  . Alcohol Use: No    Allergies:  Allergies  Allergen Reactions  . Ibuprofen Other (See Comments)    Patient is a kidney transplant patient.    Prescriptions prior to admission  Medication Sig Dispense Refill  . azaTHIOprine (IMURAN) 50 MG tablet Take 75 mg by mouth daily.      . cephALEXin (KEFLEX) 500 MG capsule Take 1 capsule (500 mg total) by mouth 3 (three) times daily.  21 capsule  0  . Cholecalciferol (VITAMIN D) 2000 UNITS tablet Take 2,000 Units by mouth daily.      . cycloSPORINE modified (NEORAL) 100 MG capsule Take 100 mg by mouth 2 (two) times daily. Combines with a 25 mg capsule for a total dose of 125 mg twice daily.      . cycloSPORINE modified (NEORAL) 25 MG capsule Take 25 mg by mouth 2 (two) times daily. Combines with a 100 mg capsule for a total dose of 125 mg twice daily.      Marland Kitchen labetalol (NORMODYNE) 100 MG tablet Take 50 mg by mouth daily.      . predniSONE (DELTASONE) 5 MG tablet Take 5 mg by mouth daily.      .  Prenat-FeCbn-FeAspGl-FA-Omega (OB COMPLETE PETITE PO) Take 1 capsule by mouth daily.        Review of Systems  Constitutional: Negative for fever and chills.  Gastrointestinal: Negative for nausea, vomiting and abdominal pain.  Genitourinary: Negative for dysuria and urgency.   Physical Exam   Blood pressure 131/84, pulse 75, temperature 98.4 F (36.9 C), temperature source Oral, resp. rate 18, height 5' 5.5" (1.664 m), weight 61.417 kg (135 lb 6.4 oz), last menstrual period 01/21/2013.  Physical Exam  Nursing note and vitals reviewed. Constitutional: She is oriented to person, place, and time. She appears well-developed and well-nourished. No distress.  Tearful with news of failed pergnancy  HENT:  Head: Normocephalic.  Eyes: Pupils are equal, round, and reactive to light.  Neck: Normal range of motion. Neck supple.  Cardiovascular: Normal rate.   Respiratory: Effort normal.  GI: Soft.  Musculoskeletal: Normal range of motion.  Neurological: She is alert and oriented to person, place, and time.  Skin: Skin is warm.  Psychiatric: She has a normal mood and affect.    MAU Course  Procedures TRANSVAGINAL OB ULTRASOUND  TECHNIQUE:  Transvaginal ultrasound  was performed for complete evaluation of the  gestation as well as the maternal uterus, adnexal regions, and  pelvic cul-de-sac.  COMPARISON: None.  FINDINGS:  Intrauterine gestational sac: Visualized/normal in shape.  Yolk sac: Visualized  Embryo: Visualized  Cardiac Activity: Absent  Heart Rate: 0 bpm  CRL: 4 mm 6 w 1 d [place] EDC: 11/23/2013  Maternal uterus/adnexae: Retroverted uterus. 2.6 cm right ovarian  corpus luteum cyst. No adnexal mass identified. Trace amount of free  fluid.  IMPRESSION:  Lack of interval progression and absent cardiac activity, consistent  with failed IUP.  Electronically Signed  By: Myles Rosenthal  On: 03/31/2013 08:53             External Result Report   Results for orders placed  during the hospital encounter of 03/31/13 (from the past 24 hour(s))  CBC     Status: Abnormal   Collection Time    03/31/13  9:30 AM      Result Value Range   WBC 5.9  4.0 - 10.5 K/uL   RBC 3.50 (*) 3.87 - 5.11 MIL/uL   Hemoglobin 11.0 (*) 12.0 - 15.0 g/dL   HCT 29.5 (*) 62.1 - 30.8 %   MCV 90.3  78.0 - 100.0 fL   MCH 31.4  26.0 - 34.0 pg   MCHC 34.8  30.0 - 36.0 g/dL   RDW 65.7  84.6 - 96.2 %   Platelets 248  150 - 400 K/uL  US Ob Transvaginal  03/31/2013   CLINICAL DATA:  Pregnancy with inconclusive viability.  EXAM: TRANSVAGINAL OB ULTRASOUND  TECHNIQUE: Transvaginal ultrasound was performed for complete evaluation of the gestation as well as the maternal uterus, adnexal regions, and pelvic cul-de-sac.  COMPARISON:  None.  FINDINGS: Intrauterine gestational sac: Visualized/normal in shape.  Yolk sac:  Visualized  Embryo:  Visualized  Cardiac Activity: Absent  Heart Rate: 0 bpm  CRL:   4  mm   6 w 1 d                  [place] EDC: 11/23/2013  Maternal uterus/adnexae: Retroverted uterus. 2.6 cm right ovarian corpus luteum cyst. No adnexal mass identified. Trace amount of free fluid.  IMPRESSION: Lack of interval progression and absent cardiac activity, consistent with failed IUP.   Electronically Signed   By: Myles Rosenthal   On: 03/31/2013 08:53   discussed cytotec and pt information sheet given to pt Prescription for phenergan and also percocet given if needed Pt to f/u with Dr. Penne Lash in [place] office  Assessment and Plan  Failed pregnancy- Cytotec in vagina at one time Prescription for phenergan and also for percocet F/u with Dr. Penne Lash Return for increase in pain or bleeding- guidelines given on pt instuction  Ewin Rehberg 03/31/2013, 10:25 AM

## 2013-04-11 NOTE — MAU Provider Note (Signed)
Pt seen and discussed plan.  Agree with above note.  Pt will go to the [place] office.

## 2013-04-21 ENCOUNTER — Encounter: Payer: Self-pay | Admitting: Obstetrics & Gynecology

## 2013-04-21 ENCOUNTER — Ambulatory Visit (INDEPENDENT_AMBULATORY_CARE_PROVIDER_SITE_OTHER): Payer: Managed Care, Other (non HMO) | Admitting: Obstetrics & Gynecology

## 2013-04-21 VITALS — BP 129/79 | HR 94 | Resp 16 | Ht 65.5 in | Wt 135.0 lb

## 2013-04-21 DIAGNOSIS — O021 Missed abortion: Secondary | ICD-10-CM

## 2013-04-21 NOTE — Progress Notes (Signed)
GYNECOLOGY CLINIC ENCOUNTER NOTE  History:  32 y.o. G1P0 here today for follow up after misoprostol treatment of six week sized MAB on 03/31/13.  She reports having some bleeding after the misoprostol and passing a "sac-like thing".  She had been spotting since then but her bleeding became heavier over the last few days. She did two home UPTs which were positive.  No other concerns.  The following portions of the patient's history were reviewed and updated as appropriate: allergies, current medications, past family history, past medical history, past social history, past surgical history and problem list. Normal pap on 07/27/12  Review of Systems:  Pertinent items are noted in HPI.  Objective:  Physical Exam LMP 01/21/2013 Gen: NAD Abd: Soft, nontender and nondistended Pelvic: Normal appearing external genitalia; normal appearing vaginal mucosa and cervix.  Normal discharge.  Small uterus, no other palpable masses, no uterine or adnexal tenderness  Bedside clinic transvaginal ultrasound: Thin endometrial stripe, no intrauterine contents visualized.  Labs and Imaging 03/31/2013   TRANSVAGINAL OB ULTRASOUND  CLINICAL DATA:  Pregnancy with inconclusive viability.  COMPARISON:  None.  FINDINGS: Intrauterine gestational sac: Visualized/normal in shape.  Yolk sac:  Visualized  Embryo:  Visualized  Cardiac Activity: Absent  Heart Rate: 0 bpm  CRL:   4  mm   6 w 1 d   [place] EDC: 11/23/2013  Maternal uterus/adnexae: Retroverted uterus. 2.6 cm right ovarian corpus luteum cyst. No adnexal mass identified. Trace amount of free fluid.  IMPRESSION: Lack of interval progression and absent cardiac activity, consistent with failed IUP.   Electronically Signed   By: Myles Rosenthal   On: 03/31/2013 08:53   03/24/2013  OBSTETRIC <14 WK TRANSVAGINAL OB US  Clinical Data: Assess viability    Comparison:  03/18/2013  Intrauterine gestational sac:  Visualized/normal in shape. Yolk sac: Seen Embryo: Seen Cardiac Activity: Seen  Heart Rate: Not recordable, bradycardic bpm   CRL: 4.7  mm  6 w  2 d             [place] EDC: 11/15/2013  Maternal uterus/adnexae: Both ovaries are visualized with the right ovary measuring 2.2 x 3.6 x 2.1 cm and containing a corpus luteum and the left ovary measuring 2.3 x 2.0 x 2.2 cm and having a normal appearance.  No pelvic fluid is seen  IMPRESSION: Single living intrauterine pregnancy demonstrating an estimated gestational age by crown-rump length of 6 weeks 2 days. Appropriate interval growth has not occurred since the previous exam on 03/18/2013 at which time estimated gestational age by crown-rump length is 6 weeks 1 day.  At real time evaluation fetal cardiac activity could be discerned but could not be accurately measured with M-mode assessment.  Fetal heart rate appeared low and was slower than seen on the prior exam (82 bpm) based on comparable cine loop evaluation.  Continued follow-up is recommended with rescanning in 1 week as the lack of appropriate interval growth and slow cardiac rate are poor prognostic indicators.  Normal ovaries.  This report was called and Eino Farber Muhanumad CNM counseled the patient.   Original Report Authenticated By: Rhodia Albright, M.D.    Assessment & Plan:  Patient is s/p six week sized MAB with misoprostol. Still having heavy bleeding, +UPT but negative ultrasound findings. Will follow up serum HCG level and manage accordingly.  Bleeding precautions reviewed.

## 2013-04-21 NOTE — Patient Instructions (Signed)
Return to clinic for any scheduled appointments or for any gynecologic concerns as needed.   

## 2013-04-22 ENCOUNTER — Other Ambulatory Visit (HOSPITAL_COMMUNITY): Payer: Managed Care, Other (non HMO)

## 2013-04-22 ENCOUNTER — Telehealth: Payer: Self-pay | Admitting: *Deleted

## 2013-04-22 DIAGNOSIS — IMO0002 Reserved for concepts with insufficient information to code with codable children: Secondary | ICD-10-CM

## 2013-04-22 NOTE — Telephone Encounter (Signed)
Message copied by Granville Lewis on Fri Apr 22, 2013  9:42 AM ------      Message from: Jaynie Collins A      Created: Fri Apr 22, 2013  9:36 AM       HCG level is still elevated, needs formal ultrasound. ------

## 2013-04-22 NOTE — Telephone Encounter (Signed)
LM on voicemail that her BHCG was still levated and per Dr Macon Large she needs an U/S.  This will be scheduled at [facility] and then Radiology will consult with the MD on call.  Spoke with pt to give her the appt time for U/S.  She stated that she starting bleeding heavy last night and passed several clots about the size of a plum.  She states that she may cancel today's appt and f/u with [place] on Monday.  Pt states that if she continues to bleed like she has been she will go over to [employer].

## 2013-04-25 ENCOUNTER — Other Ambulatory Visit (INDEPENDENT_AMBULATORY_CARE_PROVIDER_SITE_OTHER): Payer: Managed Care, Other (non HMO)

## 2013-04-25 DIAGNOSIS — O021 Missed abortion: Secondary | ICD-10-CM

## 2013-04-26 ENCOUNTER — Telehealth: Payer: Self-pay | Admitting: *Deleted

## 2013-04-26 LAB — HCG, QUANTITATIVE, PREGNANCY: hCG, Beta Chain, Quant, S: 10982.1 m[IU]/mL

## 2013-04-26 NOTE — Telephone Encounter (Signed)
Pt notified of BHCG results.  Spoke with Dr Penne Lash and since her levels are dropping we will just repeat her labs weekly until that return to normal or if they plateau intervention may be necessary.

## 2013-05-03 ENCOUNTER — Other Ambulatory Visit: Payer: Managed Care, Other (non HMO)

## 2013-05-03 DIAGNOSIS — O200 Threatened abortion: Secondary | ICD-10-CM

## 2013-05-03 LAB — HCG, QUANTITATIVE, PREGNANCY: hCG, Beta Chain, Quant, S: 7415.8 m[IU]/mL

## 2013-05-04 ENCOUNTER — Telehealth: Payer: Self-pay | Admitting: *Deleted

## 2013-05-04 NOTE — Telephone Encounter (Signed)
Called pt to adv HCG levels are continuing to drop - will have doctor review to be sure the drop amt is sufficient since they didn't drop as much this time compared to last time.

## 2013-05-10 ENCOUNTER — Other Ambulatory Visit (INDEPENDENT_AMBULATORY_CARE_PROVIDER_SITE_OTHER): Payer: Managed Care, Other (non HMO)

## 2013-05-10 DIAGNOSIS — O021 Missed abortion: Secondary | ICD-10-CM

## 2013-05-11 ENCOUNTER — Telehealth: Payer: Self-pay | Admitting: *Deleted

## 2013-05-11 LAB — HCG, QUANTITATIVE, PREGNANCY: hCG, Beta Chain, Quant, S: 2541.5 m[IU]/mL

## 2013-05-11 NOTE — Telephone Encounter (Signed)
LM on voicemail with BHCG results and she will continue with weekly BHCG draws until less than 5.

## 2013-05-17 ENCOUNTER — Other Ambulatory Visit: Payer: Managed Care, Other (non HMO)

## 2013-05-17 ENCOUNTER — Encounter (HOSPITAL_COMMUNITY): Payer: Self-pay | Admitting: *Deleted

## 2013-05-17 ENCOUNTER — Inpatient Hospital Stay (HOSPITAL_COMMUNITY)
Admission: AD | Admit: 2013-05-17 | Discharge: 2013-05-17 | Disposition: A | Discharge status: Home / Self Care | Payer: Managed Care, Other (non HMO) | Source: Ambulatory Visit | Attending: Obstetrics & Gynecology | Admitting: Obstetrics & Gynecology

## 2013-05-17 DIAGNOSIS — R109 Unspecified abdominal pain: Secondary | ICD-10-CM | POA: Insufficient documentation

## 2013-05-17 DIAGNOSIS — O021 Missed abortion: Secondary | ICD-10-CM

## 2013-05-17 DIAGNOSIS — D649 Anemia, unspecified: Secondary | ICD-10-CM | POA: Insufficient documentation

## 2013-05-17 DIAGNOSIS — O26839 Pregnancy related renal disease, unspecified trimester: Secondary | ICD-10-CM

## 2013-05-17 DIAGNOSIS — O99019 Anemia complicating pregnancy, unspecified trimester: Secondary | ICD-10-CM

## 2013-05-17 LAB — CBC
MCH: 31.6 pg (ref 26.0–34.0)
MCV: 89.5 fL (ref 78.0–100.0)
Platelets: 208 10*3/uL (ref 150–400)
RDW: 12.9 % (ref 11.5–15.5)
WBC: 6.2 10*3/uL (ref 4.0–10.5)

## 2013-05-17 MED ORDER — OXYCODONE-ACETAMINOPHEN 5-325 MG PO TABS: 2.0000 | ORAL_TABLET | ORAL | Status: DC | PRN

## 2013-05-17 MED ORDER — OXYCODONE-ACETAMINOPHEN 5-325 MG PO TABS
2.0000 | ORAL_TABLET | Freq: Once | ORAL | Status: AC
  Administered 2013-05-17: 2 via ORAL
  Filled 2013-05-17: qty 2

## 2013-05-17 NOTE — MAU Note (Signed)
Had missed  Miscarriage, took pills 09/11.(cytotec). Went back 3 wks later. Prior to that - nothing has really happened.  After [place]- things started. Last wk HCG was 2500. On rx for UTI.  Yesterday was out, pain got really bad. Bad all today, can't stand up completely.  Called office, was told to come here.

## 2013-05-17 NOTE — MAU Note (Signed)
.   Pt states she is a kidney transplant 06/2010. Pt states she has been treated for a UTI since she had miscarriage 03/31/13 Pt states she has been having abdominal pain since today.Pt states she is rating pain a 4 but has been more intense at home.

## 2013-05-17 NOTE — MAU Provider Note (Signed)
History     CSN: [id number]  Arrival date and time: 05/17/13 1510   First Provider Initiated Contact with Patient 05/17/13 1651      Chief Complaint  Patient presents with  . Abdominal Pain  . Vaginal Bleeding   HPI  [name] is a 32 y.o. female G1P0010 at unknown gestation who presents with abdominal pain. On 9/4 she had an [place] that showed a [redacted]w[redacted]d MAB; 9/11 she took Cytotec. She did not see much result from the Cytotec, and feels she may have passed a small clot at one point. Three weeks later, she had an [place] that showed nothing in her uterus done at the [place] office. Following the internal [place] she continued to bleed and have intermittent abdominal cramp. She was supposed to have a follow up visit today for Beta Hcg level, however called the office and told them she was in to much pain to come. They instructed her to come here to MAU. She is currently in a lot of pain, at times she would rate her pain 10/10. She has not taken anything for pain, yesterday she took tylenol, and in the past she has taken percocet, although currently she is out of percocet. The bleeding today is very light, this morning was a little more than usual, however very mild and light.   OB History   Grav Para Term Preterm Abortions TAB SAB Ect Mult Living   1 0   1  1   0      Past Medical History  Diagnosis Date  . Headache(784.0)   . Anemia   . 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 Abnormal Pap smear     52yrs ago, since all ok    Past Surgical History  Procedure Laterality Date  . Kidney transplant      Family History  Problem Relation Age of Onset  . Arthritis Mother   . Hypertension Father   . Hyperlipidemia Father   . Cancer Paternal Grandmother     History  Substance Use Topics  . Smoking status: Never Smoker   . Smokeless tobacco: Never Used  . Alcohol Use: No     Allergies:  Allergies  Allergen Reactions  . Ibuprofen Other (See Comments)    Patient is a kidney transplant patient.    Prescriptions prior to admission  Medication Sig Dispense Refill  . acetaminophen (TYLENOL) 500 MG tablet Take 1,000 mg by mouth every 6 (six) hours as needed for pain.      Marland Kitchen azaTHIOprine (IMURAN) 50 MG tablet Take 75 mg by mouth daily.      . Biotin 5000 MCG CAPS Take 1 capsule by mouth daily.      . Cholecalciferol (VITAMIN D) 2000 UNITS tablet Take 2,000 Units by mouth daily.      . cycloSPORINE modified (NEORAL) 100 MG capsule Take 100 mg by mouth 2 (two) times daily. Combines with a 25 mg capsule for a total dose of 125 mg twice daily.      . cycloSPORINE modified (NEORAL) 25 MG capsule Take 25 mg by mouth 2 (two) times daily. Combines with a 100 mg capsule for a total dose of 125 mg twice daily.      . diphenhydrAMINE (BENADRYL) 25 MG tablet Take 12.5-25 mg by mouth at bedtime as needed for sleep.      Marland Kitchen  docusate sodium (COLACE) 100 MG capsule Take 100 mg by mouth daily as needed for constipation.      Marland Kitchen labetalol (NORMODYNE) 100 MG tablet Take 50 mg by mouth 2 (two) times daily.       Marland Kitchen levofloxacin (LEVAQUIN) 500 MG tablet Take 500 mg by mouth daily.      Marland Kitchen oxyCODONE-acetaminophen (PERCOCET/ROXICET) 5-325 MG per tablet Take 2 tablets by mouth every 4 (four) hours as needed for pain.  15 tablet  0  . predniSONE (DELTASONE) 5 MG tablet Take 5 mg by mouth daily.      . Prenat-FeCbn-FeAspGl-FA-Omega (OB COMPLETE PETITE PO) Take 1 capsule by mouth daily.       Results for orders placed during the hospital encounter of 05/17/13 (from the past 24 hour(s))  HCG, QUANTITATIVE, PREGNANCY     Status: Abnormal   Collection Time    05/17/13  4:36 PM      Result Value Range   hCG, Beta Chain, Quant, S 1331 (*) <5 mIU/mL  CBC     Status: Abnormal   Collection Time    05/17/13  4:36 PM      Result Value Range   WBC 6.2  4.0 - 10.5 K/uL   RBC 2.94 (*) 3.87 - 5.11  MIL/uL   Hemoglobin 9.3 (*) 12.0 - 15.0 g/dL   HCT 21.3 (*) 08.6 - 57.8 %   MCV 89.5  78.0 - 100.0 fL   MCH 31.6  26.0 - 34.0 pg   MCHC 35.4  30.0 - 36.0 g/dL   RDW 46.9  62.9 - 52.8 %   Platelets 208  150 - 400 K/uL   Beta chain, [place] 9/11- day of Cytotec= 48307 10/2= 26987 10/6= 10982 10/14= 7415 10/21= 2541 10/28= 1331    Review of Systems  Constitutional: Negative for fever and chills.  Gastrointestinal: Positive for nausea and abdominal pain. Negative for vomiting, diarrhea, constipation and blood in stool.  Genitourinary: Negative for dysuria, urgency, frequency and hematuria.       No vaginal discharge. + vaginal bleeding. Light.  No dysuria.    Physical Exam   Blood pressure 106/68, pulse 76, temperature 98.6 F (37 C), temperature source Oral, resp. rate 16, height 5' 4.5" (1.638 m), weight 61.236 kg (135 lb), last menstrual period 01/21/2013.  Physical Exam  Constitutional: She is oriented to person, place, and time. She appears well-developed and well-nourished. No distress.  HENT:  Head: Normocephalic.  Eyes: Pupils are equal, round, and reactive to light.  Neck: Neck supple.  Respiratory: Effort normal.  GI: Soft. She exhibits no distension. There is tenderness. There is no rebound and no guarding.  Bilateral lower abdominal tenderness   Genitourinary:  Bimanual exam: Cervix closed, anterior  Uterus non tender, normal size Adnexa non tender, no masses bilaterally Chaperone present for exam. Scant amount of blood on exam glove    Musculoskeletal: Normal range of motion.  Neurological: She is alert and oriented to person, place, and time.  Skin: Skin is warm. She is not diaphoretic. No pallor.    MAU Course  Procedures None  MDM CBC Beta Hcg 2 percocet in MAU  Bimanual exam   Assessment and Plan  A: Missed AB Abdominal pain  Anemia   P: Discharge home Call [facility] tomorrow and schedule a follow up visit for 1 week for Beta hcg  level Bleeding precautions discussed  Pelvic rest Return to MAU if pain worsens RX: percocet Continue prenatal vitamins.    RASCH,  JENNIFER IRENE FNP-C  05/17/2013, 7:29 PM

## 2013-05-18 ENCOUNTER — Other Ambulatory Visit: Payer: Managed Care, Other (non HMO)

## 2013-05-24 ENCOUNTER — Other Ambulatory Visit (INDEPENDENT_AMBULATORY_CARE_PROVIDER_SITE_OTHER): Payer: Managed Care, Other (non HMO)

## 2013-05-24 DIAGNOSIS — O039 Complete or unspecified spontaneous abortion without complication: Secondary | ICD-10-CM

## 2013-05-25 ENCOUNTER — Telehealth: Payer: Self-pay | Admitting: *Deleted

## 2013-05-25 LAB — HCG, QUANTITATIVE, PREGNANCY: hCG, Beta Chain, Quant, S: 570.3 m[IU]/mL

## 2013-05-25 NOTE — Telephone Encounter (Signed)
Pt notified of BHCG results continue to decrease and repeat in 2 weeks.

## 2013-06-07 ENCOUNTER — Other Ambulatory Visit (INDEPENDENT_AMBULATORY_CARE_PROVIDER_SITE_OTHER): Payer: Managed Care, Other (non HMO)

## 2013-06-07 DIAGNOSIS — O039 Complete or unspecified spontaneous abortion without complication: Secondary | ICD-10-CM

## 2013-06-08 ENCOUNTER — Telehealth: Payer: Self-pay | Admitting: *Deleted

## 2013-06-08 NOTE — Telephone Encounter (Signed)
Pt notified of BHCG results and repeat in 3 weeks.

## 2013-06-28 ENCOUNTER — Other Ambulatory Visit (INDEPENDENT_AMBULATORY_CARE_PROVIDER_SITE_OTHER): Payer: Managed Care, Other (non HMO)

## 2013-06-28 DIAGNOSIS — O021 Missed abortion: Secondary | ICD-10-CM

## 2013-06-29 ENCOUNTER — Telehealth: Payer: Self-pay | Admitting: *Deleted

## 2013-06-29 NOTE — Telephone Encounter (Signed)
LM on voicemail with BHCG results,  Will repeat in 4 weeks.

## 2013-08-02 ENCOUNTER — Other Ambulatory Visit (INDEPENDENT_AMBULATORY_CARE_PROVIDER_SITE_OTHER): Payer: Managed Care, Other (non HMO)

## 2013-08-02 DIAGNOSIS — O021 Missed abortion: Secondary | ICD-10-CM

## 2013-08-02 NOTE — Progress Notes (Signed)
Pt states she might have started a period but not sure. Will check BHCG today

## 2013-08-03 LAB — HCG, QUANTITATIVE, PREGNANCY: HCG, BETA CHAIN, QUANT, S: 24 m[IU]/mL

## 2013-08-08 ENCOUNTER — Telehealth: Payer: Self-pay | Admitting: *Deleted

## 2013-08-08 DIAGNOSIS — O039 Complete or unspecified spontaneous abortion without complication: Secondary | ICD-10-CM

## 2013-08-08 NOTE — Telephone Encounter (Signed)
Spoke with Dr Penne LashLeggett concerning latest BHCG on pt.  She had a missed AB in October.  Her BHCG's are decreasing but still not back to less than 5.  Dr Penne LashLeggett ordered a TVU to see if any POC are present.  Order put into Epic.

## 2013-08-17 ENCOUNTER — Other Ambulatory Visit (INDEPENDENT_AMBULATORY_CARE_PROVIDER_SITE_OTHER): Payer: Managed Care, Other (non HMO)

## 2013-08-17 DIAGNOSIS — O021 Missed abortion: Secondary | ICD-10-CM

## 2013-08-18 ENCOUNTER — Telehealth: Payer: Self-pay | Admitting: *Deleted

## 2013-08-18 LAB — HCG, QUANTITATIVE, PREGNANCY: HCG, BETA CHAIN, QUANT, S: 3.2 m[IU]/mL

## 2013-08-18 NOTE — Telephone Encounter (Signed)
Pt notified that her BHCG is now 3.2 and no further need to repeat the levels.

## 2013-10-07 ENCOUNTER — Ambulatory Visit: Payer: Managed Care, Other (non HMO) | Admitting: Family

## 2013-10-14 ENCOUNTER — Encounter: Payer: Self-pay | Admitting: Advanced Practice Midwife

## 2013-10-14 ENCOUNTER — Ambulatory Visit (INDEPENDENT_AMBULATORY_CARE_PROVIDER_SITE_OTHER): Payer: Managed Care, Other (non HMO) | Admitting: Advanced Practice Midwife

## 2013-10-14 VITALS — BP 153/97 | HR 94 | Resp 16 | Ht 64.5 in | Wt 137.0 lb

## 2013-10-14 DIAGNOSIS — O0991 Supervision of high risk pregnancy, unspecified, first trimester: Secondary | ICD-10-CM

## 2013-10-14 DIAGNOSIS — Z940 Kidney transplant status: Secondary | ICD-10-CM

## 2013-10-14 DIAGNOSIS — Z01419 Encounter for gynecological examination (general) (routine) without abnormal findings: Secondary | ICD-10-CM

## 2013-10-14 DIAGNOSIS — O0990 Supervision of high risk pregnancy, unspecified, unspecified trimester: Secondary | ICD-10-CM

## 2013-10-14 DIAGNOSIS — Z1151 Encounter for screening for human papillomavirus (HPV): Secondary | ICD-10-CM

## 2013-10-14 DIAGNOSIS — Z789 Other specified health status: Secondary | ICD-10-CM

## 2013-10-14 DIAGNOSIS — Z124 Encounter for screening for malignant neoplasm of cervix: Secondary | ICD-10-CM

## 2013-10-14 MED ORDER — PRENATAL VITAMINS 0.8 MG PO TABS: 1.0000 | ORAL_TABLET | Freq: Every day | ORAL | Status: AC

## 2013-10-14 MED ORDER — OB COMPLETE PETITE 35-5-1-200 MG PO CAPS: 1.0000 | ORAL_CAPSULE | Freq: Every day | ORAL | Status: AC

## 2013-10-14 NOTE — Patient Instructions (Signed)

## 2013-10-14 NOTE — Progress Notes (Signed)
  Subjective:     Julie Tucker is a 33 y.o. female here for a routine exam.  Current complaints: Trying to get pregnant. Had miscarriage recently Is taking PNV and has adjusted transplant meds.  Personal health questionnaire reviewed: no.   Gynecologic History Patient's last menstrual period was 10/04/2013. Contraception: none Last Pap: 2014, Jan.. Results were: normal      6 years ago had abnormal pap Last mammogram: none.   Obstetric History OB History  Gravida Para Term Preterm AB SAB TAB Ectopic Multiple Living  1 0   1 1    0    # Outcome Date GA Lbr Len/2nd Weight Sex Delivery Anes PTL Lv  1 SAB                The following portions of the patient's history were reviewed and updated as appropriate: allergies, current medications, past family history, past medical history, past social history, past surgical history and problem list.  Review of Systems Pertinent items are noted in HPI.    Objective:    General appearance: alert, cooperative and no distress Back: symmetric, no curvature. ROM normal. No CVA tenderness. Lungs: clear to auscultation bilaterally Breasts: normal appearance, no masses or tenderness, Inspection negative, No nipple retraction or dimpling, No nipple discharge or bleeding Heart: regular rate and rhythm, S1, S2 normal, no murmur, click, rub or gallop Abdomen: soft, non-tender; bowel sounds normal; no masses,  no organomegaly and Healed scar RLQ where donor kidney was placed (from husband) Pelvic: cervix normal in appearance, external genitalia normal, no adnexal masses or tenderness, no cervical motion tenderness, rectovaginal septum normal, uterus normal size, shape, and consistency, vagina normal without discharge and Cervix located just under pubic bone to pt's right    Assessment:    Healthy female exam.    Plan:    Education reviewed: self breast exams and Preconception, vitamins. Follow up in: 1 year.

## 2013-10-24 ENCOUNTER — Encounter: Payer: Self-pay | Admitting: *Deleted

## 2014-05-22 ENCOUNTER — Encounter: Payer: Self-pay | Admitting: Advanced Practice Midwife

## 2015-04-21 IMAGING — US US OB TRANSVAGINAL
1 series · 14 of 23 positions shown · non-contrast
Comparison: None.

CLINICAL DATA: Vaginal spotting.

EXAM:
OBSTETRIC <14 WK ULTRASOUND
TECHNIQUE: Transabdominal ultrasound was performed for evaluation of the
gestation as well as the maternal uterus and adnexal regions.

[Series 1: us ob comp less 14 wks · 23 acquisitions, 14 frames shown]
[im 1/23]
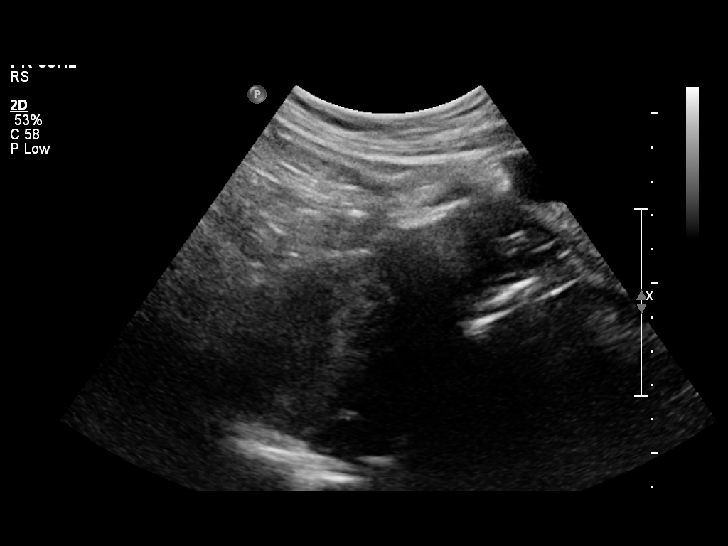
[im 3/23]
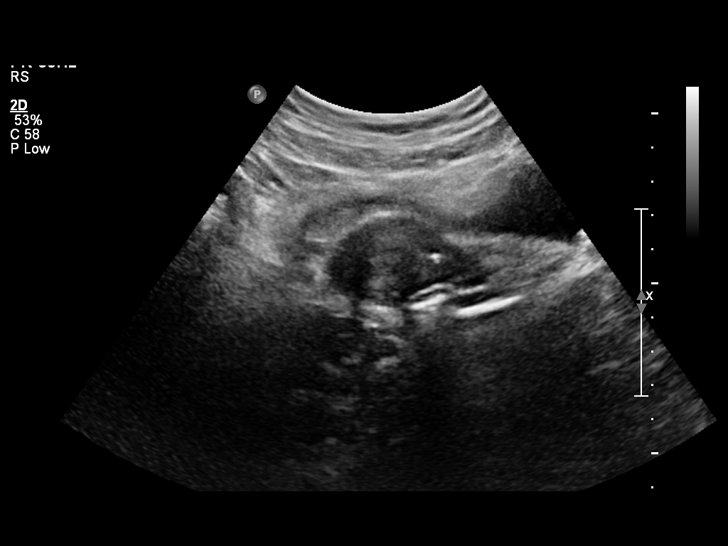
[im 5/23]
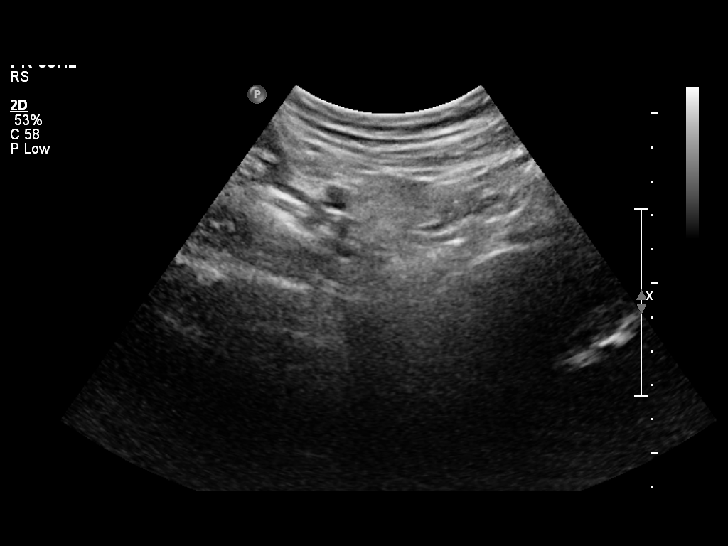
[im 6/23]
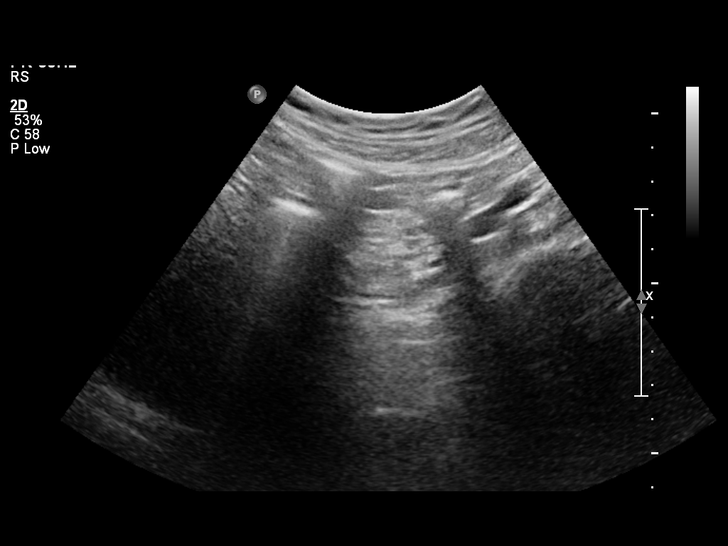
[im 8/23]
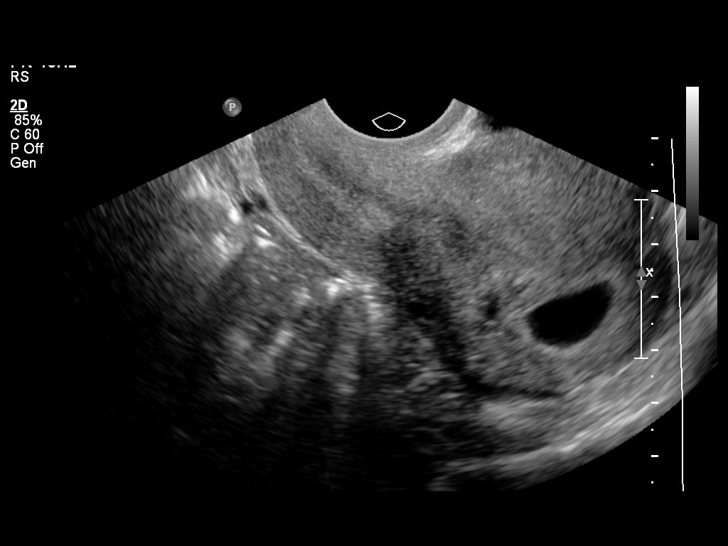
[im 10/23]
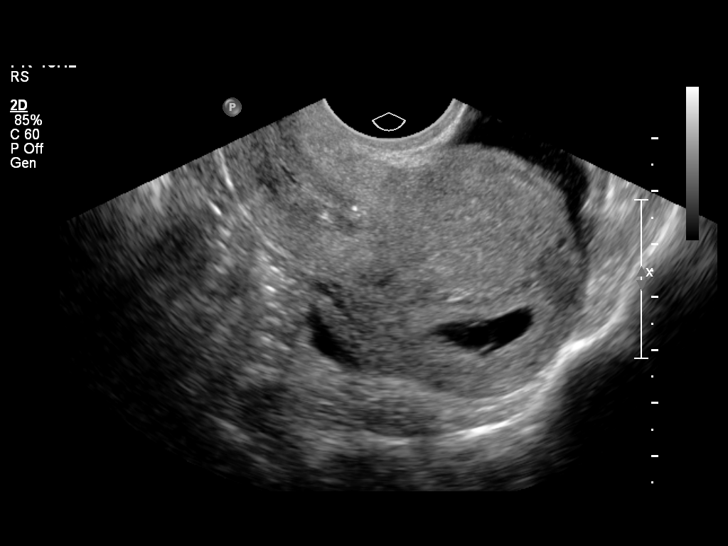
[im 11/23]
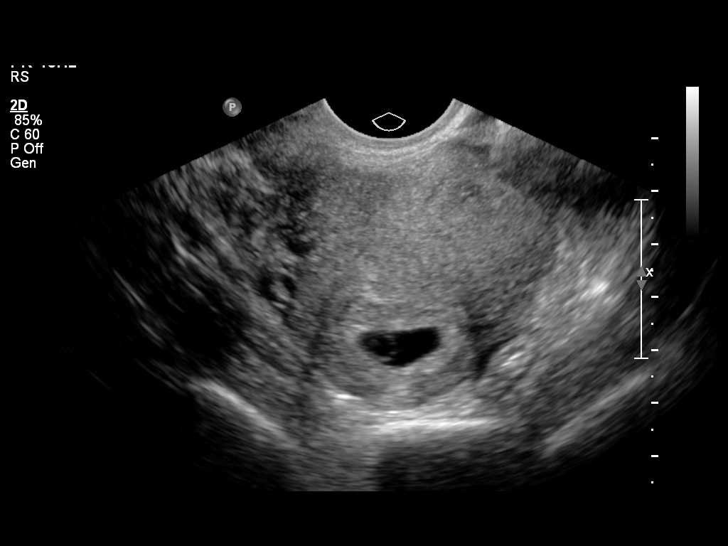
[im 13/23]
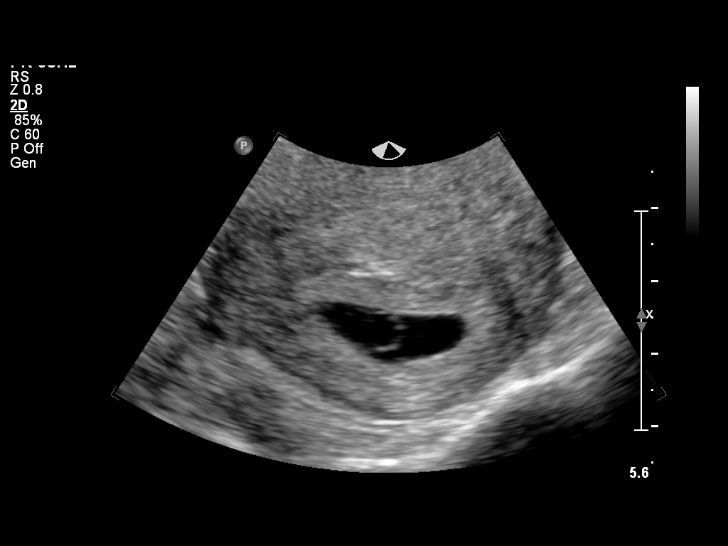
[im 14/23]
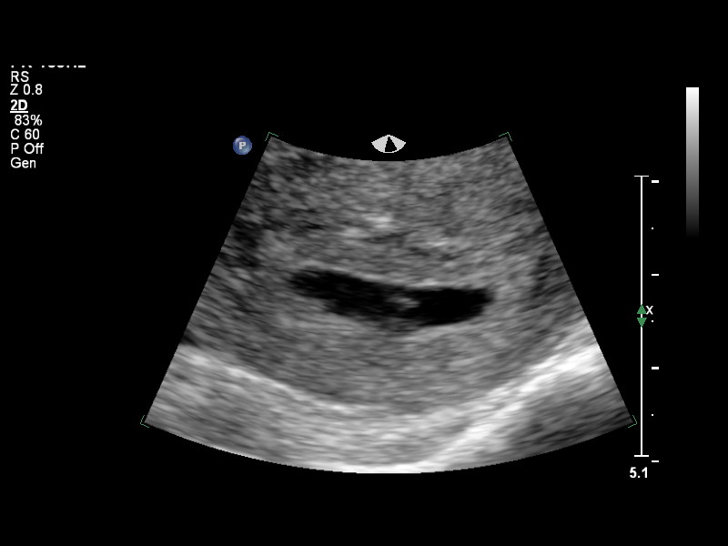
[im 16/23]
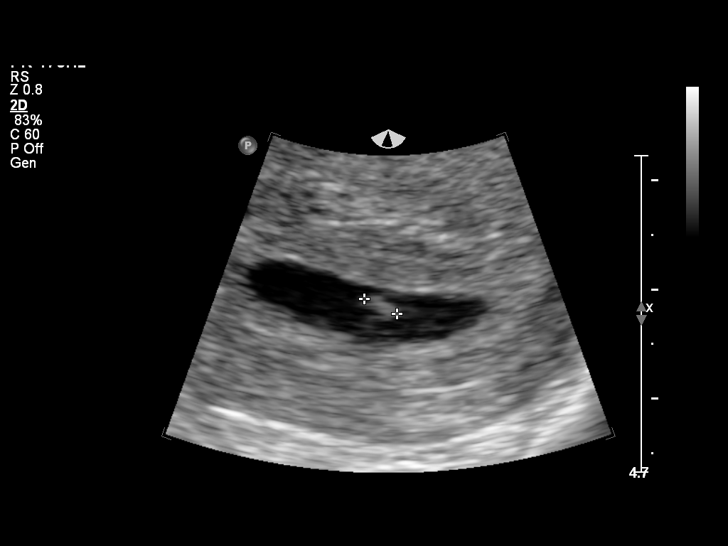
[im 18/23]
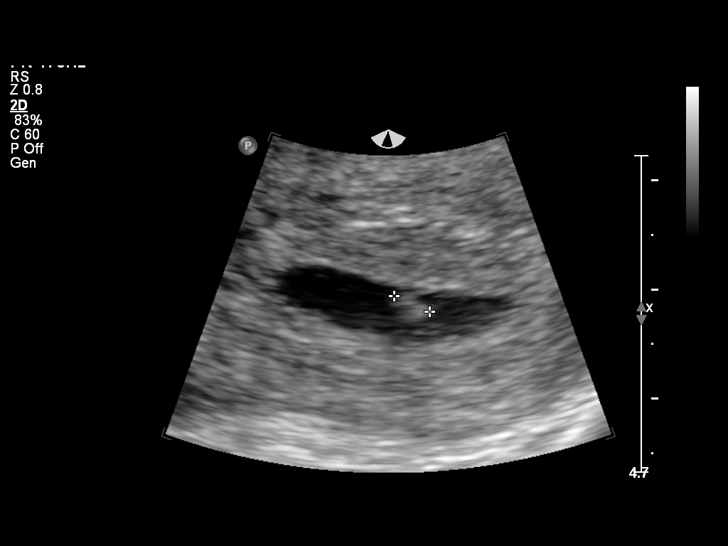
[im 19/23]
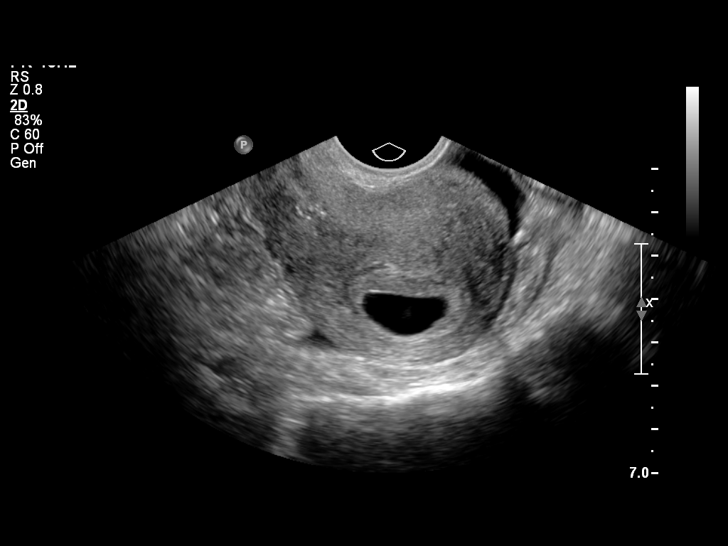
[im 21/23]
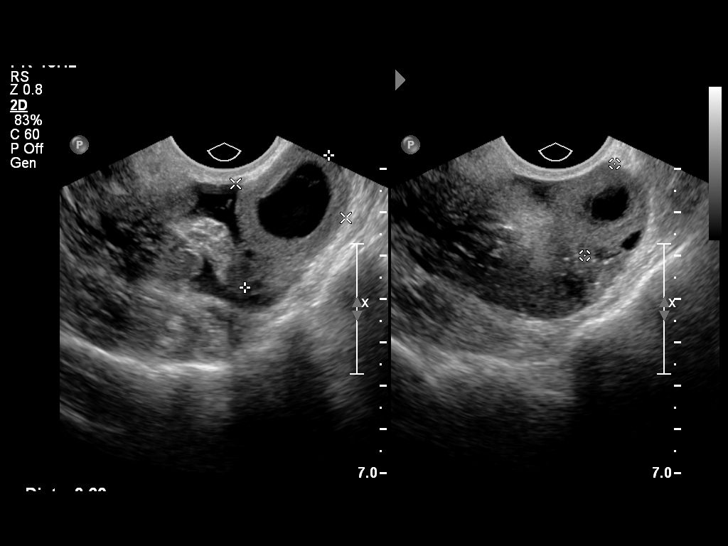
[im 23/23]
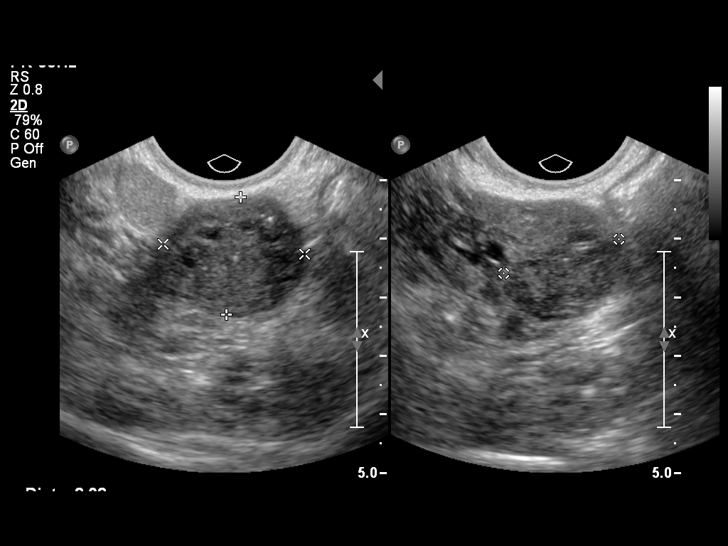

[14 of 23 positions shown; findings below may reference images not displayed]

FINDINGS: Intrauterine gestational sac: Visualized/normal in shape.

Yolk sac:  Present

Embryo:  Present

Cardiac Activity: Present

Heart Rate: 82 bpm

CRL:   3.6  mm   6 w 1 d                  US EDC: 11/10/2013

Maternal uterus/adnexae: Right corpus luteal cyst. No adnexal
masses. Small amount of free fluid. Uterus is retroflexed.
IMPRESSION: Six week 1 day intrauterine pregnancy. Fetal heart rate low at 82
beats per min. No visible subchorionic hemorrhage.

Small amount of free fluid in the pelvis.

## 2015-04-27 IMAGING — US US OB TRANSVAGINAL
1 series · 13 of 26 positions shown · non-contrast
Comparison: 03/18/2013

CLINICAL DATA: Assess viability

OBSTETRIC <14 WK TRANSVAGINAL OB US
TECHNIQUE: Transvaginal ultrasound examination was performed for
complete evaluation of the gestation as well as the maternal
uterus, adnexal regions, and pelvic cul-de-sac.

[Series 1: us ob transvaginal · 26 acquisitions, 13 frames shown]
[im 2/26]
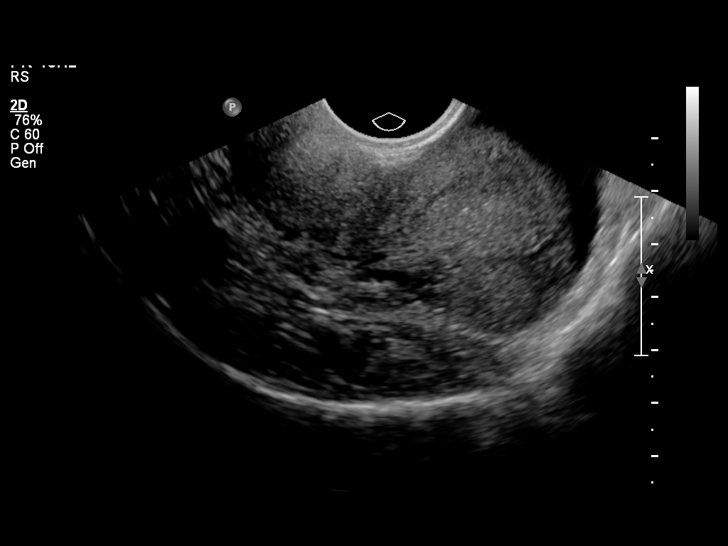
[im 4/26]
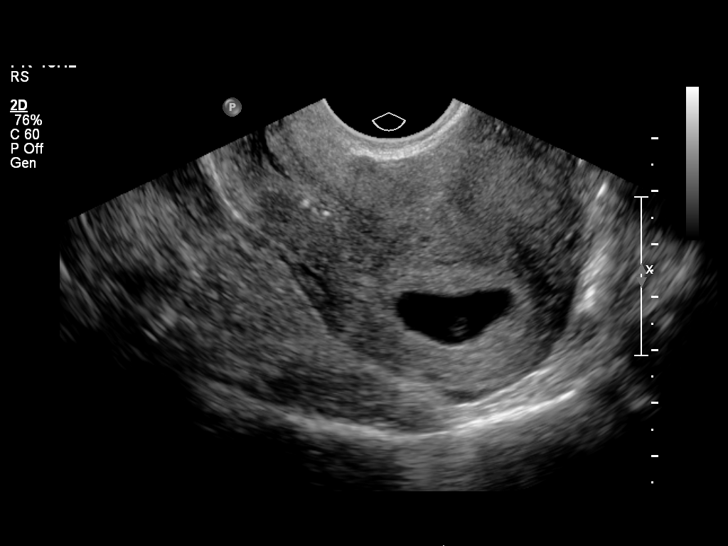
[im 6/26]
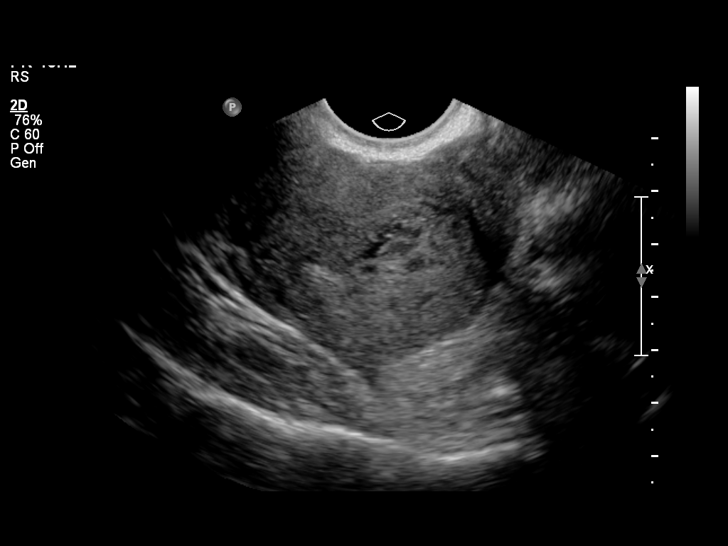
[im 8/26]
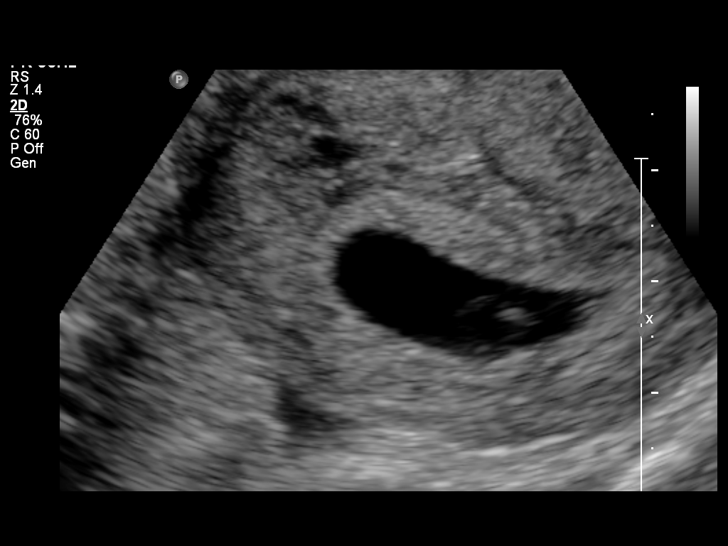
[im 10/26]
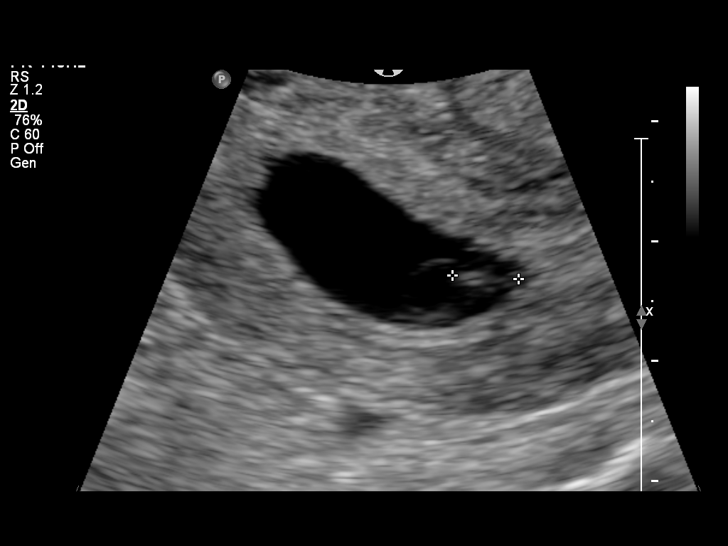
[im 12/26]
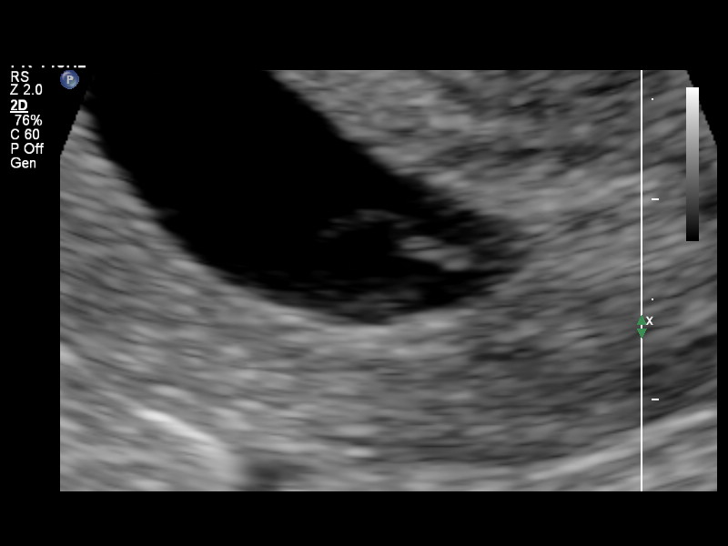
[im 14/26]
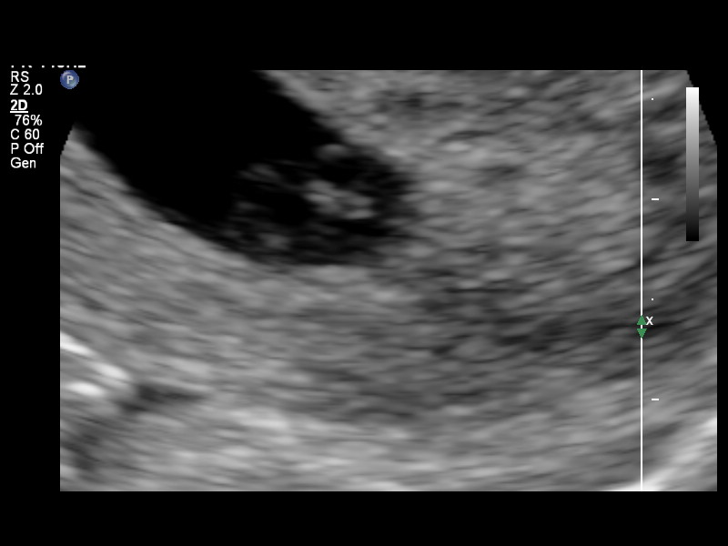
[im 16/26]
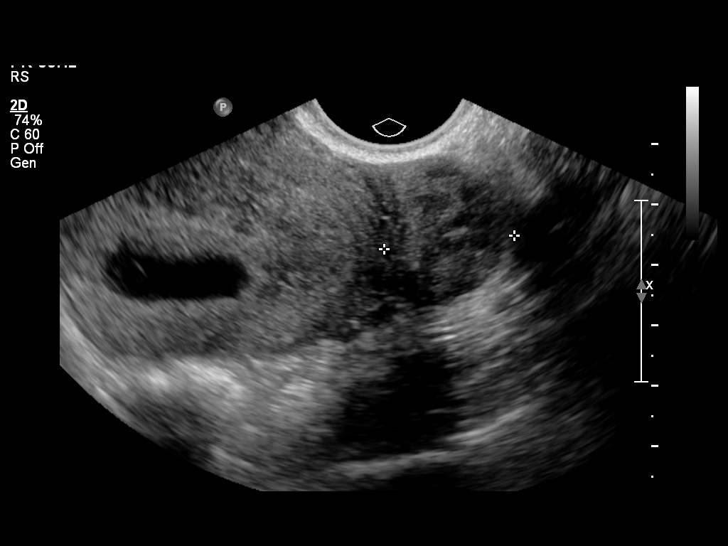
[im 18/26]
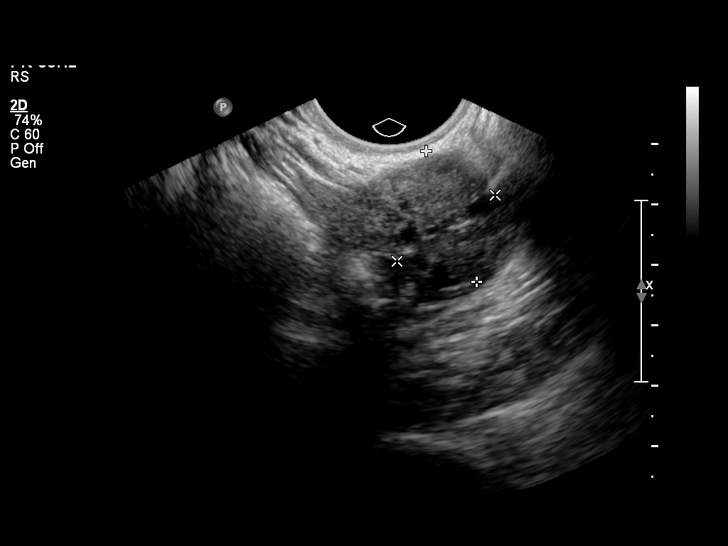
[im 20/26]
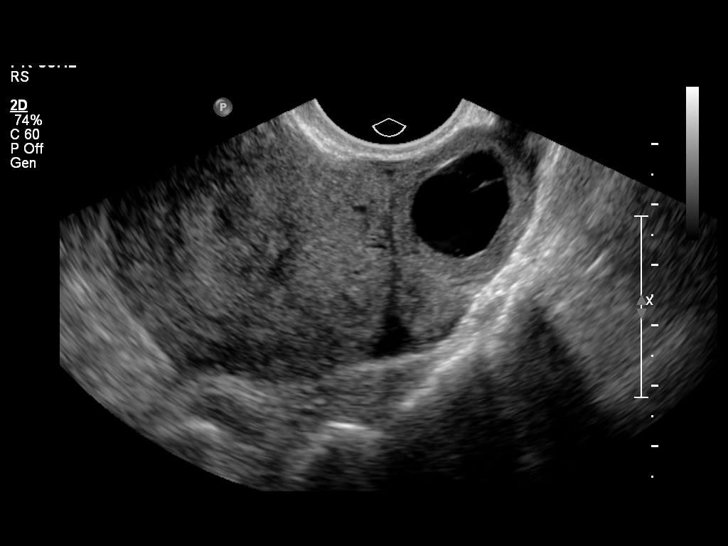
[im 22/26]
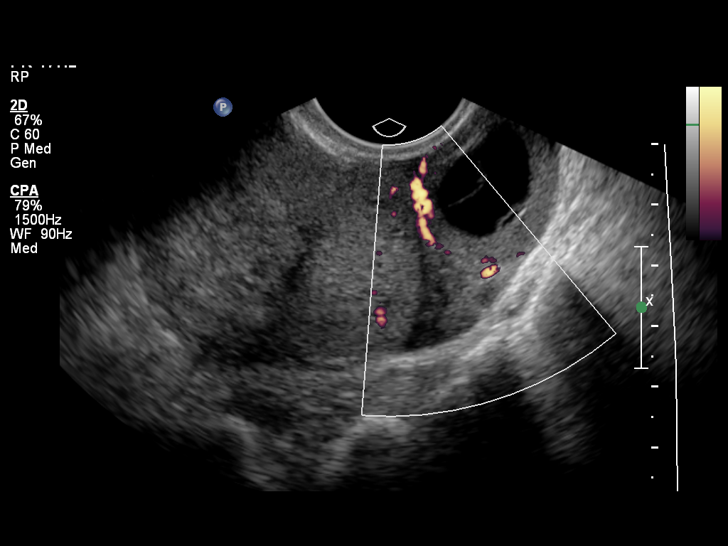
[im 24/26]
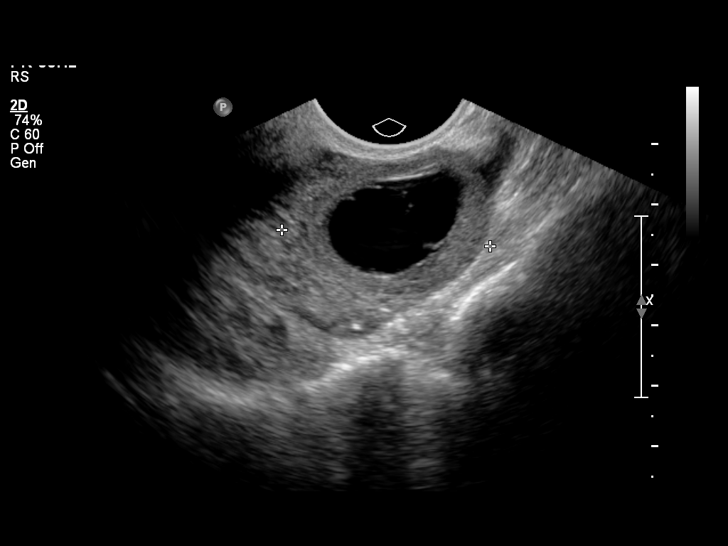
[im 26/26]
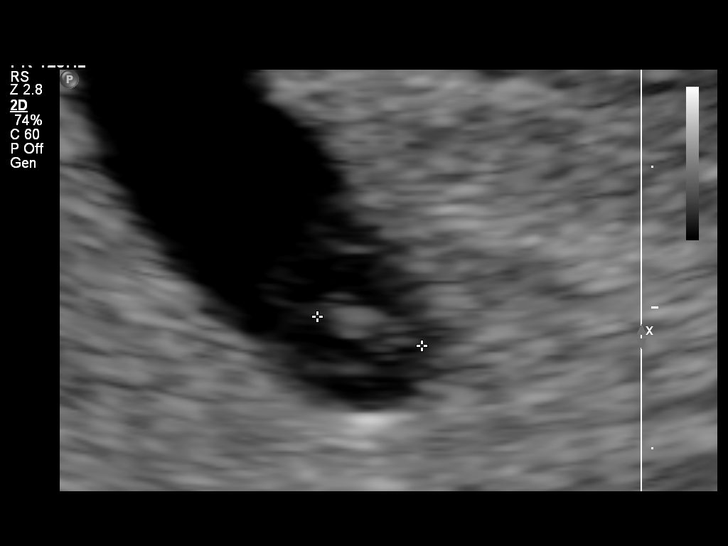

[13 of 26 positions shown; findings below may reference images not displayed]

Intrauterine gestational sac:  Visualized/normal in shape.
Yolk sac: Seen
Embryo: Seen
Cardiac Activity: Seen
Heart Rate: Not recordable, bradycardic bpm

 CRL: 4.7  mm  6 w  2 d             US EDC: 11/15/2013

Maternal uterus/adnexae:
Both ovaries are visualized with the right ovary measuring 2.2 x
3.6 x 2.1 cm and containing a corpus luteum and the left ovary
measuring 2.3 x 2.0 x 2.2 cm and having a normal appearance.  No
pelvic fluid is seen
IMPRESSION: Single living intrauterine pregnancy demonstrating an estimated
gestational age by crown-rump length of 6 weeks 2 days. Appropriate
interval growth has not occurred since the previous exam on
03/18/2013 at which time estimated gestational age by crown-rump
length is 6 weeks 1 day.

At real time evaluation fetal cardiac activity could be discerned
but could not be accurately measured with M-mode assessment.  Fetal
heart rate appeared low and was slower than seen on the prior exam
(82 bpm) based on comparable cine loop evaluation.  Continued
follow-up is recommended with rescanning in 1 week as the lack of
appropriate interval growth and slow cardiac rate are poor
prognostic indicators.

Normal ovaries.

This report was called and Giorgi Jumper CNM counseled the
patient.

## 2015-05-04 IMAGING — US US OB TRANSVAGINAL
1 series · 14 of 28 positions shown · non-contrast
Comparison: None.

CLINICAL DATA: Pregnancy with inconclusive viability.

EXAM:
TRANSVAGINAL OB ULTRASOUND
TECHNIQUE: Transvaginal ultrasound was performed for complete evaluation of the
gestation as well as the maternal uterus, adnexal regions, and
pelvic cul-de-sac.

[Series 1: us ob transvaginal · 57 acquisitions, 14 frames shown]
[im 3/57]
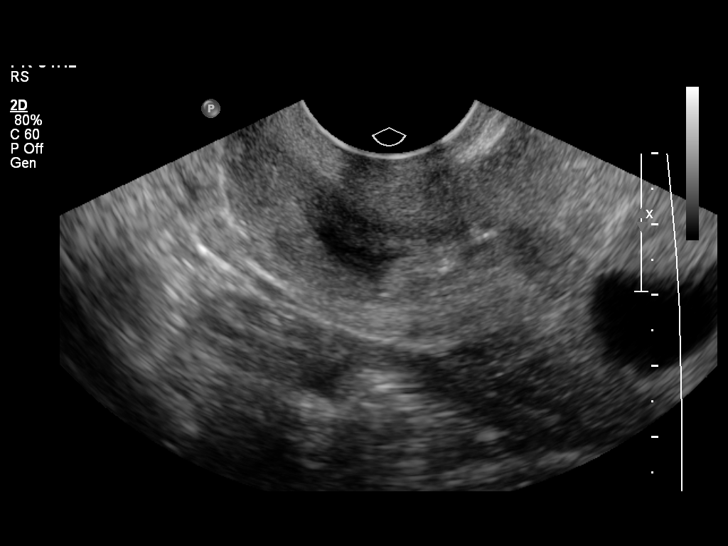
[im 7/57]
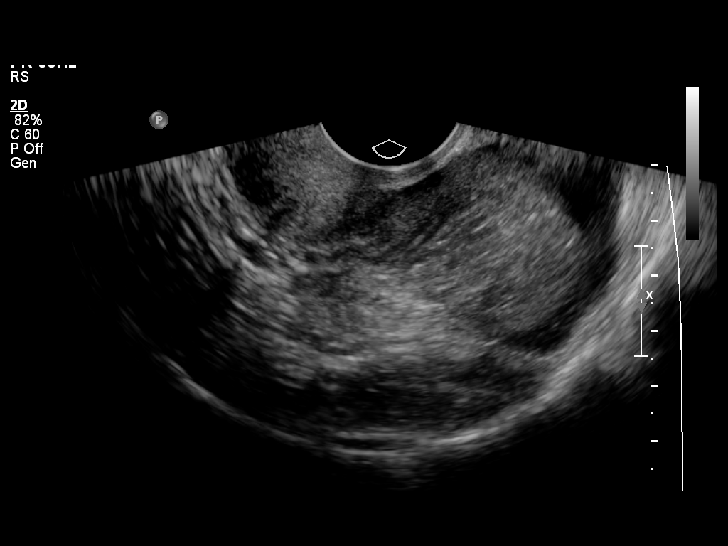
[im 11/57]
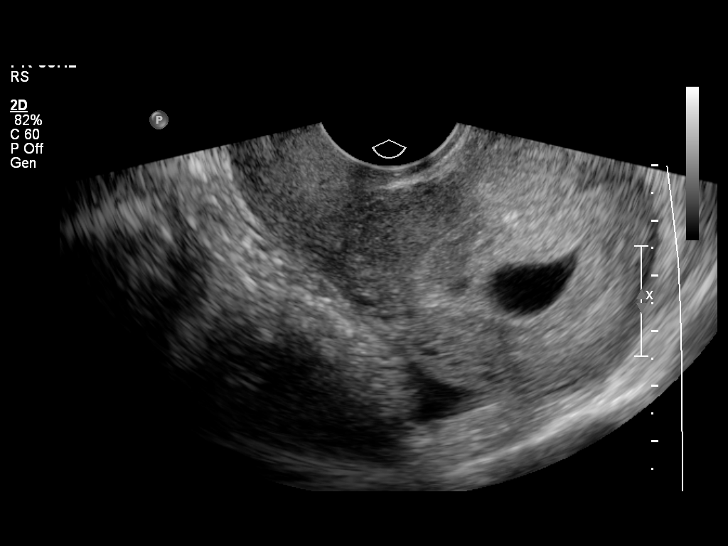
[im 15/57]
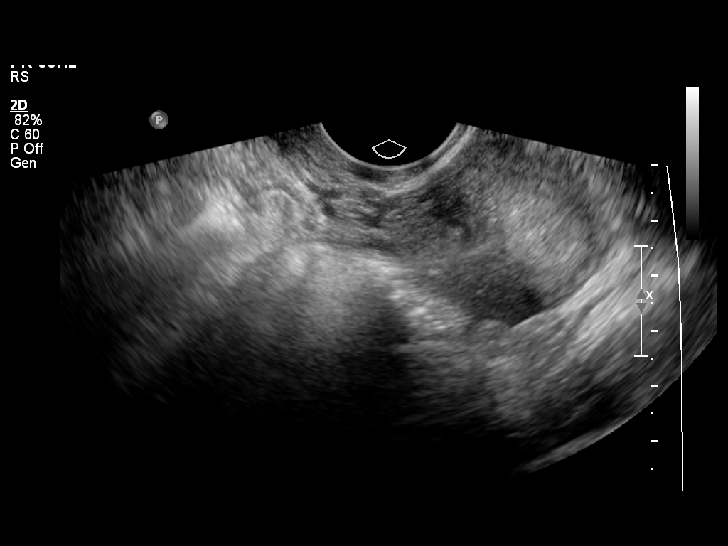
[im 19/57]
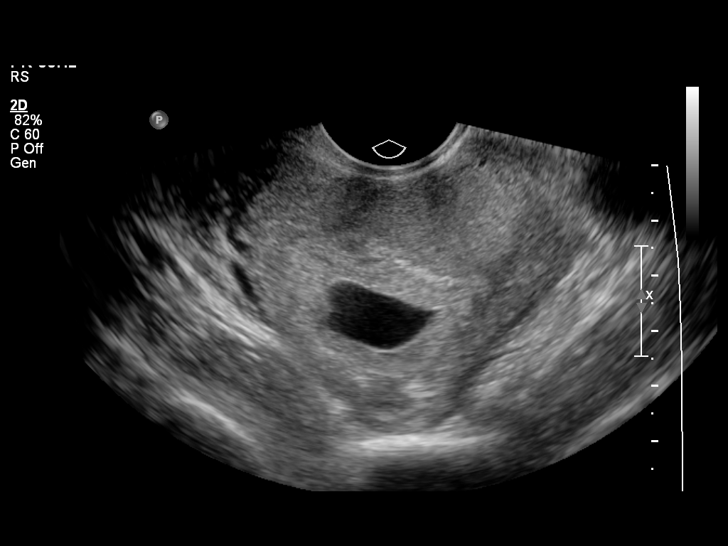
[im 23/57]
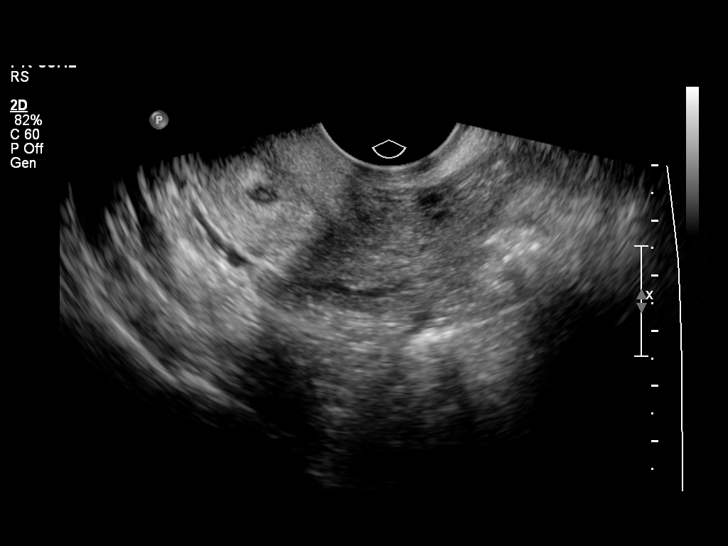
[im 27/57]
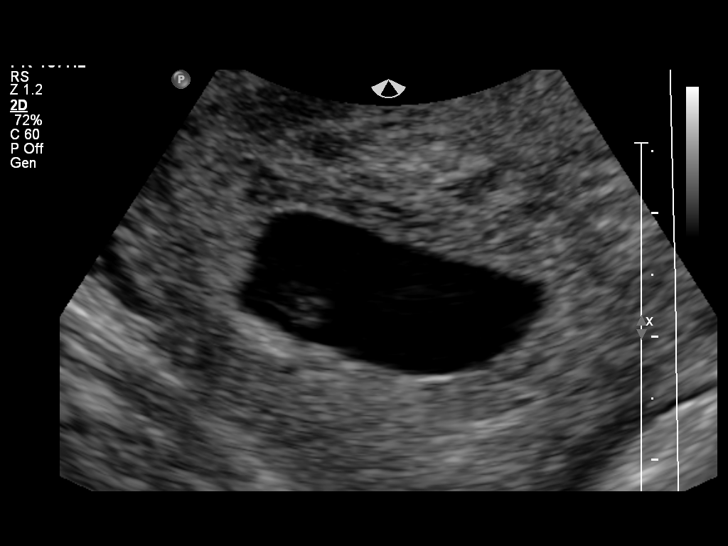
[im 32/57]
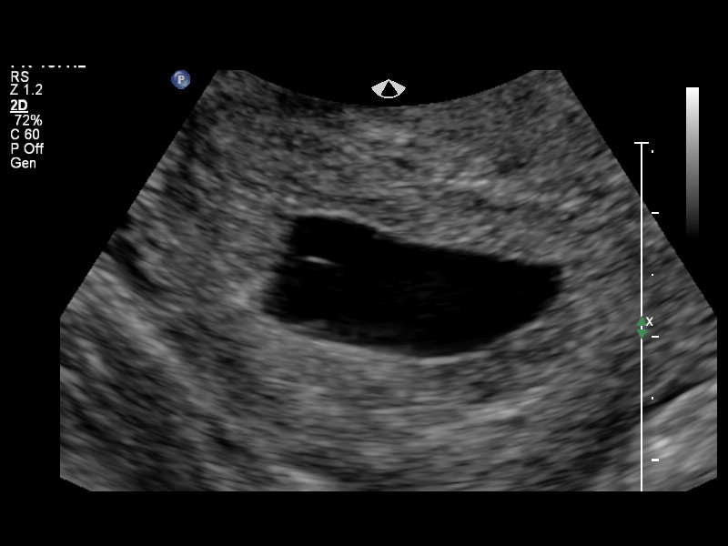
[im 36/57]
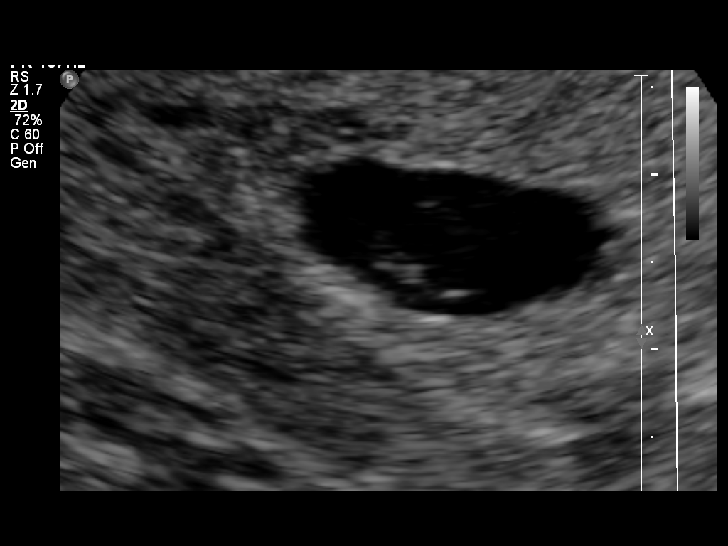
[im 40/57]
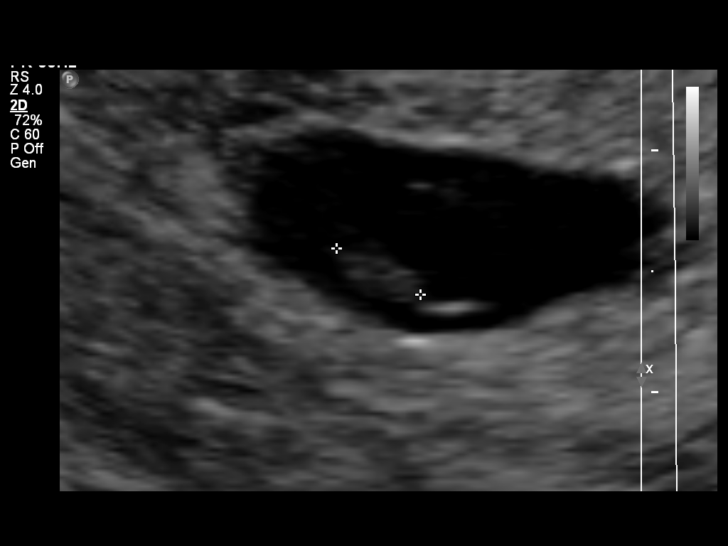
[im 44/57]
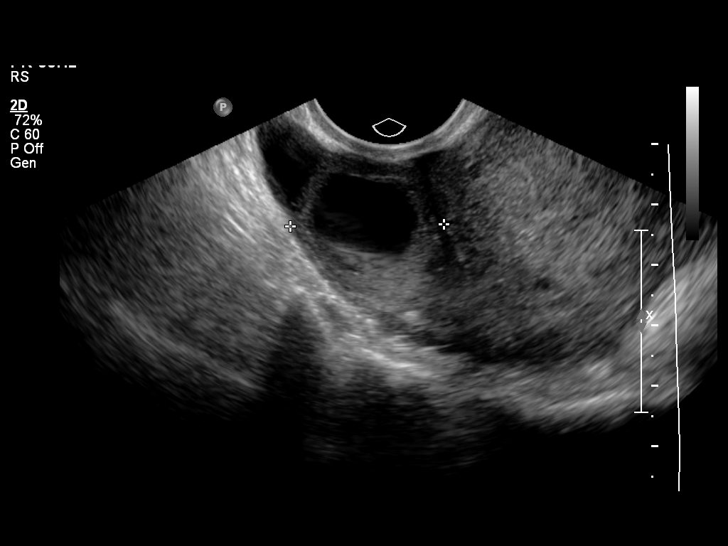
[im 48/57]
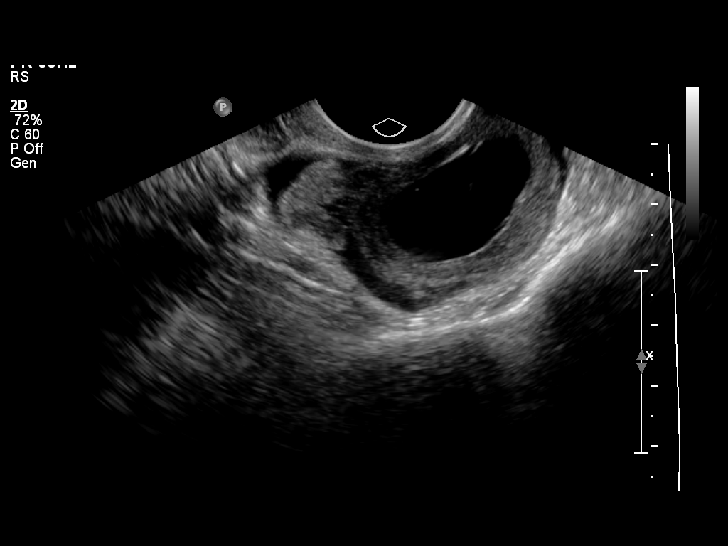
[im 52/57]
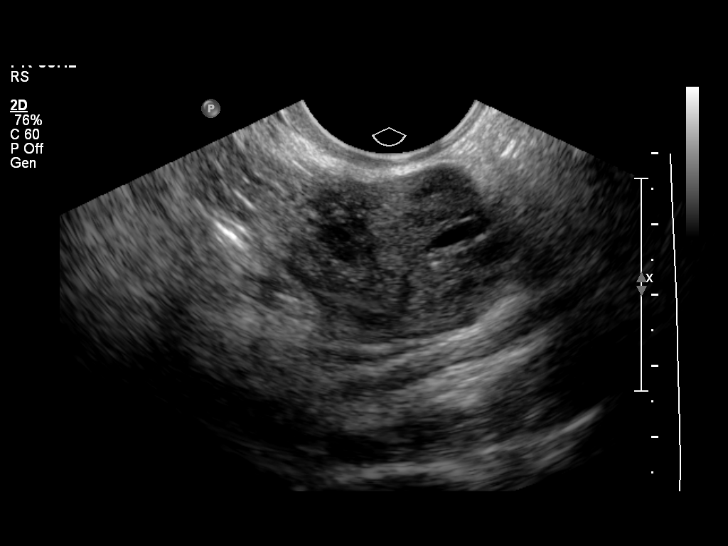
[im 57/57]
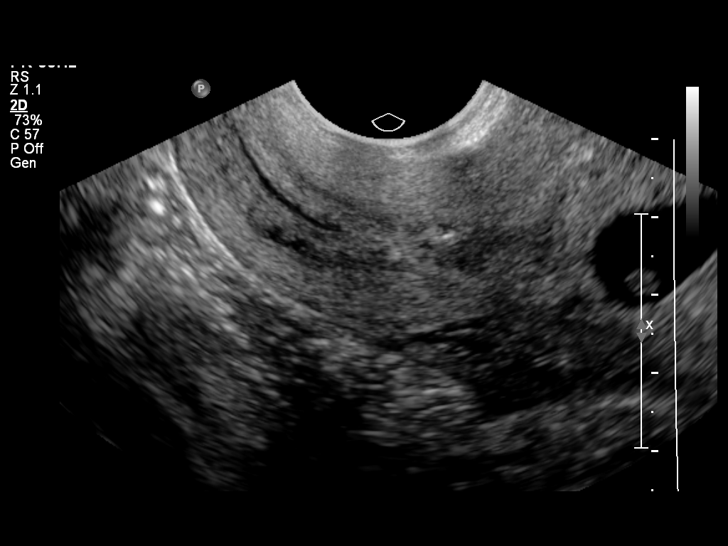

[14 of 28 positions shown; findings below may reference images not displayed]

FINDINGS: Intrauterine gestational sac: Visualized/normal in shape.

Yolk sac:  Visualized

Embryo:  Visualized

Cardiac Activity: Absent

Heart Rate: 0 bpm

CRL:   4  mm   6 w 1 d                  US EDC: 11/23/2013

Maternal uterus/adnexae: Retroverted uterus. 2.6 cm right ovarian
corpus luteum cyst. No adnexal mass identified. Trace amount of free
fluid.
IMPRESSION: Lack of interval progression and absent cardiac activity, consistent
with failed IUP.
